# Patient Record
Sex: Male | Born: 1975 | Race: Black or African American | Hispanic: No
Health system: Western US, Academic
[De-identification: ages and names within clinical notes are randomized; demographics above are authoritative.]

## PROBLEM LIST (undated history)

## (undated) HISTORY — PX: HERNIA REPAIR: SHX51

---

## 2012-11-12 ENCOUNTER — Ambulatory Visit (HOSPITAL_BASED_OUTPATIENT_CLINIC_OR_DEPARTMENT_OTHER): Payer: No Typology Code available for payment source | Admitting: Clinical

## 2012-11-12 ENCOUNTER — Ambulatory Visit (HOSPITAL_BASED_OUTPATIENT_CLINIC_OR_DEPARTMENT_OTHER): Payer: No Typology Code available for payment source | Attending: Clinical

## 2012-11-12 DIAGNOSIS — Z658 Other specified problems related to psychosocial circumstances: Secondary | ICD-10-CM | POA: Insufficient documentation

## 2012-11-12 NOTE — Progress Notes (Signed)
Pt presents to [facility] triage nurse at [phone] this morning requesting assist in exploration of available mental health resources.  Pt reports concerns of paranoia, anger management issues and hx of PTSD; all in the setting of homelessness x 15 years.  Pt states that he is new to [place] x 3 days following arrival from [place], [place].  Pt states that he is currently sleeping outside on the street at night. Plan for pt to meet with [facility] Social Worker, Jaquita Rector MSW, for exploration of mental health and homeless resources.  Pt verbalizes understanding and agreement with plan.

## 2012-11-12 NOTE — Progress Notes (Signed)
SOCIAL WORK WALK-IN NOTE    11/12/2012  Brief description: Mental Health Resources    Patient description, presenting problem, patient / family goals: 37 yo male, referred by Triage RN for [facility] resources.    Per pt, he is new to [place] coming from [place].  He states he needs to return to [facility] services, as he was dropped by his MH providers at [facility].  He states he started working and he could no longer receive services.     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      He doesn't know [place] and reports he has difficulty reading.      He is originally from [place].   States he's been homeless x14 years.       Intervention / Outcome / Plan: SW support provided.   Provided pt with a map showing him where to catch the [employer] bus, and how to identify it by [employer], it's a block away.    Encouraged him to apply for his DSHS funding at the [place] office.  He needs to transfer his DSHS funding to [facility].    Explained how to go to [facility] to get services, once his funding is re started.     Provided him with a list of nearby substance programs.  He knows [facility].     Suggested he get a primary care provider.     Pt accepted information, but was upset he could not received MH services and get housing at this clinic.

## 2013-01-27 ENCOUNTER — Encounter (HOSPITAL_BASED_OUTPATIENT_CLINIC_OR_DEPARTMENT_OTHER): Payer: Self-pay | Admitting: Internal Medicine

## 2013-01-27 ENCOUNTER — Ambulatory Visit (HOSPITAL_BASED_OUTPATIENT_CLINIC_OR_DEPARTMENT_OTHER): Payer: No Typology Code available for payment source | Attending: Internal Medicine

## 2013-01-27 ENCOUNTER — Ambulatory Visit (HOSPITAL_BASED_OUTPATIENT_CLINIC_OR_DEPARTMENT_OTHER): Payer: No Typology Code available for payment source | Admitting: Internal Medicine

## 2013-01-27 VITALS — BP 128/70 | HR 84 | Temp 99.1°F | Resp 18 | Ht 74.0 in | Wt 177.0 lb

## 2013-01-27 DIAGNOSIS — Z23 Encounter for immunization: Secondary | ICD-10-CM | POA: Insufficient documentation

## 2013-01-27 DIAGNOSIS — F17200 Nicotine dependence, unspecified, uncomplicated: Secondary | ICD-10-CM

## 2013-01-27 DIAGNOSIS — J069 Acute upper respiratory infection, unspecified: Secondary | ICD-10-CM | POA: Insufficient documentation

## 2013-01-27 DIAGNOSIS — K469 Unspecified abdominal hernia without obstruction or gangrene: Secondary | ICD-10-CM | POA: Insufficient documentation

## 2013-01-27 DIAGNOSIS — F489 Nonpsychotic mental disorder, unspecified: Secondary | ICD-10-CM

## 2013-01-27 HISTORY — DX: Nicotine dependence, unspecified, uncomplicated: F17.200

## 2013-01-27 HISTORY — DX: Nonpsychotic mental disorder, unspecified: F48.9

## 2013-01-27 MED ORDER — DEXTROMETHORPHAN-GUAIFENESIN 10-100 MG/5ML OR LIQD
5.0000 mL | ORAL | Status: AC | PRN
Start: 2013-01-27 — End: ?

## 2013-01-27 MED ORDER — ACETAMINOPHEN 325 MG OR TABS
325.0000 mg | ORAL_TABLET | Freq: Four times a day (QID) | ORAL | Status: AC | PRN
Start: 2013-01-27 — End: ?

## 2013-01-27 NOTE — Progress Notes (Signed)
Pt presents to the [facility] triage nurse today c/o URI symptoms x 2 days. Pt is triaged to walk-in appt at [facility] today. Pt verbalizes understanding and agreement with plan.

## 2013-01-27 NOTE — Progress Notes (Signed)
[facility]   This note transcribed with voice recognition software. May contain errors.     Chief Complaint:  Chief Complaint   Patient presents with   . Hernia   . Headache   . URI   . FLU   . Fever   . Chills     Dalton Peck is a 37 year old year old English speaking male seen for an urgent care appointment in [facility] with chief complaint of cold symptoms and cough.  Patient is originally from [place].  He most recently moved here from [place], [place].  He is living at [facility].  He is in his first 30 days of sobriety and cannot easily attend appointments.  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    HISTORY PRESENT ILLNESS  1. URI/cough:  Patient complains of cold symptoms for last couple of days.  He has a low-grade fever doesn't feel particularly tired or achy but he has a productive cough for green phlegm.  He is a smoker and not quite ready to quit smoking.  He has tried patches in the past.  Burgess Estelle he felt wheezy but he has no history of asthma.    2.  Hernia  Patient states he noticed a lump to the right side of his groin for about 3 years.  It is not painful.  The lump seems to come and go.  He states he has not had any medical attention for this problem.    Problem list:  1.  History of mental health problems patient is currently taking Zyprexa and Abilify [facility]. Reports conflict with counselor.     Allergies  Milk/dairy  Depakote caused rash.    ROS:   Patient endorses symptoms of lightheadedness when he stands.  No history of heart disease, palpitations, irregular heartbeat, or syncope during exercise.    HABITS:  Bodybuilder     Physical Examination:   Filed Vitals:    01/27/13 0935   BP: 128/70   Pulse: 84   Temp: 99.1 F (37.3 C)   TempSrc: Temporal   Resp: 18   Height: 6\' 2"  (1.88 m)   Weight: 177 lb (80.287 kg)   SpO2: 100%   Alert muscular African-American male   GEN: alert male in NAD.      EAR, NOSE AND THROAT:  Unremarkable.   NECK:  Supple.  ,  no adenopathy to neck.   HEART:  Normal S1, S2, regular rhythm and rate.   CHEST:  Breath sounds are clear.   GENITOURINARY:  Patient has a 2 cm small inguinal hernia right side, easily reduced..      Assessment/Plan:    1. Need for prophylactic vaccination and inoculation against influenza   Plan: influenza vaccine quadrivalent PF (adult) 0.5     mL IM - 09811    2. URI, acute  Plan: Dextromethorphan-Guaifenesin 10-100 MG/5ML Oral        Liquid, Acetaminophen 325 MG Oral Tab    3.  Right-sided inguinal hernia.  Discussed signs and symptoms of incarceration and strangulation and when to seek care.  See visit summary. Discussed surgery is usually elective.    4.  Mental health issues.  Patient is having [facility]  He states security was called and he doesn't feel there was a reason for that.  He is interested in changing to a different mental health  agency.  I referred him to social work as I don't understand what his coverage limits are with [facility] and whether he is eligible to change providers.

## 2013-01-27 NOTE — Progress Notes (Signed)
FLU VACCINE SCREENING    Order date:01/27/2013  Ordering provider: Octavio Manns, ARNP  Vaccine given: INFLUENZA VACCINE  Clinic stock used: NO  Interpreter used?: None  Flu Vaccine information sheet discussed, patient verbalized understanding?  YES    SCREENING QUESTIONS:  1. Do you have a high fever, severe cold-like symptoms or serious infection?  NO  2. Do you have any food allergies such as eggs, chicken, chicken feathers, chicken dander, or gelatin?  NO  3. Are you allergic to any medication/s includeing neomycin, streptomycin, or polymyxin?  NO  4. Have you had any severe reaction to a vaccine in the past such as a seizure, a convulsion from a high fever, or a paralysis problem called Guillain-Barre Syndrome?  NO     Aniel, Cristoper, 01/27/2013 10:28 AM

## 2013-01-27 NOTE — Patient Instructions (Signed)
Hernia [Adult]    A hernia is a bulge of the intestines or surrounding tissues through a tear in the muscle of the abdomen or groin. This may occur as a result of excessive coughing, heavy lifting or being overweight. It can also occur at the site of prior surgery. When a hernia first appears it may be painful due to stretching and tearing of the muscle fibers. When you lie down, the bulge should reduce in size or disappear completely. If it does not, and you are unable to flatten it with your hand, medical attention is needed at once.  Home Care:  Avoid heavy lifting and straining or any activities that cause pain in the hernia.  Follow Up  with your physician as directed by our staff.  Get Prompt Medical Attention  if any of the following occur:   Increasing size of the hernia   Increasing pain in the hernia   A hernia that does not get smaller when you lie down   Hardening of the hernia   Abdominal swelling, fever or repeated vomiting   Pain moves to the lower right abdomen (just below the waistline) or spreads to the back   2000-2013 [employer], [address], [place] [address]. All rights reserved. This information is not intended as a substitute for professional medical care. Always follow your healthcare professional's instructions.

## 2013-02-05 ENCOUNTER — Ambulatory Visit (HOSPITAL_BASED_OUTPATIENT_CLINIC_OR_DEPARTMENT_OTHER): Payer: No Typology Code available for payment source

## 2013-02-05 ENCOUNTER — Ambulatory Visit (HOSPITAL_BASED_OUTPATIENT_CLINIC_OR_DEPARTMENT_OTHER): Payer: No Typology Code available for payment source | Admitting: Nurse Practitioner

## 2013-02-05 NOTE — Progress Notes (Signed)
Pt presents to [facility] triage nurse requesting care for a hernia which has become more painful. As no walk-in appointments are available at [facility] today, triage nurse encourages pt to seek eval at [facility] today. Pt verbalizes understanding and agreement with plan.

## 2013-02-05 NOTE — Progress Notes (Signed)
Presented to clinic for hernia problem. His insurance is not accepted here, but is at [facility]. Made him an appointment for today there. He was appreciative and went to appointment.

## 2013-04-10 ENCOUNTER — Other Ambulatory Visit (EMERGENCY_DEPARTMENT_HOSPITAL): Payer: Self-pay | Admitting: Emergency Medicine

## 2013-04-10 ENCOUNTER — Emergency Department (HOSPITAL_BASED_OUTPATIENT_CLINIC_OR_DEPARTMENT_OTHER)
Admission: EM | Admit: 2013-04-10 | Discharge: 2013-04-10 | Disposition: A | Discharge status: Home / Self Care | Payer: Medicaid Other | Attending: Emergency Medicine | Admitting: Emergency Medicine

## 2013-04-10 DIAGNOSIS — K469 Unspecified abdominal hernia without obstruction or gangrene: Secondary | ICD-10-CM | POA: Insufficient documentation

## 2013-04-10 DIAGNOSIS — R109 Unspecified abdominal pain: Secondary | ICD-10-CM

## 2013-04-10 DIAGNOSIS — F39 Unspecified mood [affective] disorder: Secondary | ICD-10-CM | POA: Insufficient documentation

## 2013-04-10 LAB — COMPREHENSIVE METABOLIC PANEL
ALT (GPT): 12 U/L (ref 10–64)
AST (GOT): 25 U/L (ref 15–40)
Albumin: 3.7 g/dL (ref 3.5–5.2)
Alkaline Phosphatase (Total): 65 U/L (ref 36–122)
Anion Gap: 4 (ref 3–11)
Bilirubin (Total): 0.2 mg/dL (ref 0.2–1.3)
Calcium: 9.1 mg/dL (ref 8.9–10.2)
Carbon Dioxide, Total: 29 mEq/L (ref 22–32)
Chloride: 104 mEq/L (ref 98–108)
Creatinine: 1.2 mg/dL — ABNORMAL HIGH (ref 0.51–1.18)
GFR, Calc, African American: >60 mL/min (ref >59)
GFR, Calc, European American: >60 mL/min (ref >59)
Glucose: 86 mg/dL (ref 62–125)
Potassium: 4.2 mEq/L (ref 3.7–5.2)
Protein (Total): 6.9 g/dL (ref 6.0–8.2)
Sodium: 137 mEq/L (ref 136–145)
Urea Nitrogen: 9 mg/dL (ref 8–21)

## 2013-04-10 LAB — CBC, DIFF
% Basophils: 0 %
% Eosinophils: 4 %
% Immature Granulocytes: 0 %
% Lymphocytes: 26 %
% Monocytes: 7 %
% Neutrophils: 63 %
Absolute Eosinophil Count: 0.38 10*3/uL (ref 0.00–0.50)
Absolute Lymphocyte Count: 2.55 10*3/uL (ref 1.00–4.80)
Basophils: 0.03 10*3/uL (ref 0.00–0.20)
Hematocrit: 41 % (ref 38–50)
Hemoglobin: 13.8 g/dL (ref 13.0–18.0)
Immature Granulocytes: 0.02 10*3/uL (ref 0.00–0.05)
MCH: 29.6 pg (ref 27.3–33.6)
MCHC: 34.1 g/dL (ref 32.2–36.5)
MCV: 87 fL (ref 81–98)
Monocytes: 0.66 10*3/uL (ref 0.00–0.80)
Neutrophils: 6.35 10*3/uL (ref 1.80–7.00)
Platelet Count: 320 10*3/uL (ref 150–400)
RBC: 4.66 mil/uL (ref 4.40–5.60)
RDW-CV: 12.2 % (ref 11.6–14.4)
WBC: 9.99 10*3/uL (ref 4.30–10.00)

## 2013-04-10 LAB — 1ST EXTRA BLUE TOP

## 2013-06-24 ENCOUNTER — Emergency Department (HOSPITAL_BASED_OUTPATIENT_CLINIC_OR_DEPARTMENT_OTHER)
Admission: EM | Admit: 2013-06-24 | Discharge: 2013-06-24 | Disposition: A | Discharge status: Home / Self Care | Payer: No Typology Code available for payment source | Attending: Physician Assistant | Admitting: Physician Assistant

## 2013-06-24 DIAGNOSIS — Z5900 Homelessness unspecified: Secondary | ICD-10-CM | POA: Insufficient documentation

## 2013-06-24 DIAGNOSIS — K4090 Unilateral inguinal hernia, without obstruction or gangrene, not specified as recurrent: Secondary | ICD-10-CM

## 2013-06-28 ENCOUNTER — Encounter (HOSPITAL_BASED_OUTPATIENT_CLINIC_OR_DEPARTMENT_OTHER): Payer: Self-pay | Admitting: Surgery

## 2013-06-28 ENCOUNTER — Ambulatory Visit (HOSPITAL_BASED_OUTPATIENT_CLINIC_OR_DEPARTMENT_OTHER): Payer: No Typology Code available for payment source

## 2013-06-28 ENCOUNTER — Ambulatory Visit (HOSPITAL_BASED_OUTPATIENT_CLINIC_OR_DEPARTMENT_OTHER): Payer: No Typology Code available for payment source | Attending: Surgery | Admitting: Surgery

## 2013-06-28 ENCOUNTER — Other Ambulatory Visit (HOSPITAL_BASED_OUTPATIENT_CLINIC_OR_DEPARTMENT_OTHER): Payer: Self-pay

## 2013-06-28 VITALS — BP 121/73 | HR 81 | Temp 97.7°F | Resp 19 | Ht 72.25 in | Wt 171.1 lb

## 2013-06-28 DIAGNOSIS — K4090 Unilateral inguinal hernia, without obstruction or gangrene, not specified as recurrent: Secondary | ICD-10-CM | POA: Insufficient documentation

## 2013-06-28 DIAGNOSIS — Z01818 Encounter for other preprocedural examination: Secondary | ICD-10-CM | POA: Insufficient documentation

## 2013-06-28 NOTE — Progress Notes (Addendum)
Patient Referred By: Kirby CriglerGatewood, Medley O'Keef*  Patient's PCP: No, Pcp     Subjective:  Patient is a 38 year old male, here to discuss hernia    HPI  Pt noticed pain in R groin circa 2011, then 2013 found a bulge there that tended to be worse when he worked out (likes to [employer] weights). Recently has been bothering him more and more. States it will bulge out and feel painful. Able to reduce when lying down. He's quit working out because of it. No instances where he can't get it back in. Occ nausea when it pops out but no vomiting, fevers. No recent illnesses. Never had surgery and states he is very anxious about it.     PMH: depression  PSH: none  Meds: none currently; has taken meds for depression in the past, but he does not currently have a psychiatric provider  Allergies: depakote (rash)  Fam hx: no known hx of bleeding problems or trouble with anesthesia  Social: [facility]. [place]. Likes to bodybuild. Quit smoking 4.5 weeks ago. Previously used cocaine and EtoH; quit 3 months ago.     Review of Systems   Constitutional: Negative.    HENT: Negative.    Eyes: Negative.    Respiratory: Negative.    Cardiovascular: Negative.    Gastrointestinal:        Per HPI   Genitourinary:        Per HPI     Musculoskeletal: Negative.    Skin: Negative.          Objective:  BP 121/73   Pulse 81   Temp(Src) 97.7 F (36.5 C) (Temporal)   Resp 19   Ht 6' 0.25" (1.835 m)   Wt 171 lb 1.2 oz (77.599 kg)   BMI 23.05 kg/m2       Physical Exam   Constitutional: He is oriented to person, place, and time. He appears well-developed and well-nourished.   HENT:   Mouth/Throat: Oropharynx is clear and moist.   Eyes: Conjunctivae are normal. Pupils are equal, round, and reactive to light.   Pulmonary/Chest: Effort normal.   Abdominal: Soft.   2x2 cm bulge in R groin, easily reducible. No L groin bulge.    Musculoskeletal: Normal range of motion.   Neurological: He is alert and oriented to person, place, and  time.   Skin: Skin is warm and dry.        Assessment and Plan:   38 yo M with RIH, reducible, no hx or evidence of incarceration. He is amenable to surgical repair now that it is bothering him every day and keeping him from working out. Discussed the risks and benefits of the procedure, which he is nervous about, but he is a good surgical candidate. He agrees to proceed with R open inguinal hernia repair with mesh.     Plan  Surgery scheduled for May 5 with Dr Logan BoresEvans, consent signed  Strict return precatuions for incarceration in the meantime  Advised him to continue abstaining from smoking       -------------------------------------------  Attending: Kimber RelicHeather Leigh Evans  I saw and evaluated the patient and agree with Dr. Christy Sartoriusondino's note.   Additional diagnoses: depression  ----------------------------------------

## 2013-06-28 NOTE — Patient Instructions (Addendum)
Hernia [Adult]    A hernia is a bulge of the intestines or surrounding tissues through a tear in the muscle of the abdomen or groin. This may occur as a result of excessive coughing, heavy lifting or being overweight. It can also occur at the site of prior surgery. When a hernia first appears it may be painful due to stretching and tearing of the muscle fibers. When you lie down, the bulge should reduce in size or disappear completely. If it does not, and you are unable to flatten it with your hand, medical attention is needed at once.  Home Care:  Avoid heavy lifting and straining or any activities that cause pain in the hernia.  Follow Up  with your physician as directed by our staff.  Get Prompt Medical Attention  if any of the following occur:   Increasing size of the hernia   Increasing pain in the hernia   A hernia that does not get smaller when you lie down   Hardening of the hernia   Abdominal swelling, fever or repeated vomiting   Pain moves to the lower right abdomen (just below the waistline) or spreads to the back   2000-2013 [employer], [address], [place] [address]. All rights reserved. This information is not intended as a substitute for professional medical care. Always follow your healthcare professional's instructions.        Hernia Repair Surgery  A hernia (or "rupture") is a weakness or defect in the wall of the abdomen. A hernia will not heal on its own. Surgery is needed to repair the defect in the abdominal wall. If not treated, a hernia can get larger. It can also lead to serious medical complications. Fortunately, hernia surgery can be done quickly and safely. Below is an overview of hernia repair surgery.  Preparing for Surgery  Your doctor will talk with you about preparing for surgery. Follow all the instructions you're given and be sure to:   Tell your doctor about any medications, supplements, or herbs you take. This includes both prescription and  over-the-counter items. Ask if you should stop taking any of them.   Stop taking aspirin, ibuprofen, and naproxen 14 days before surgery.   Arrange for an adult family member or friend to give you a ride home after surgery.   Stop smoking. Smoking affects blood flow and can slow healing.   Gently wash the surgical area the night before surgery.   Don't eat or drink for 10 hours before your surgery.  The Day of Surgery  Arrive at the hospital or surgical center at your scheduled time. You'll be asked to change into a patient gown. You'll then be given an IV to provide fluids and medication. Shortly before surgery, an anesthesiologist will talk with you. He or she will explain the types of anesthesia used to prevent pain during surgery. You will have one or more of the following:   Monitored sedation to make you relaxed and sleepy.   Local anesthesia to numb the surgical site.   Regional anesthesia to numb specific areas of your body.   General anesthesia to let you sleep during surgery.  Fixing the Weakness  Surgery treats a hernia by repairing the weakness in the abdominal wall. Most hernias are treated using "tension-free" repairs. This is surgery that uses special mesh materials to repair the weak area. The mesh covers the weak area like a patch. The mesh is made of strong, flexible plastic that stays   in the body. Over time, nearby tissues grow into the mesh to strengthen the repair.  After Surgery  When the procedure is over, you'll be taken to the recovery area to rest. Your blood pressure and heart rate will be monitored. You'll also have a bandage over the surgical site. To help reduce discomfort, you'll be given pain medications. You may also be given breathing exercises to keep your lungs clear. Later, you'll be asked to get up and walk. This helps prevent blood clots in the legs. You can go home when your doctor says you're ready.  Risks and Possible Complications of Hernia  Surgery   Bleeding   Infection   Numbness or pain in the groin or leg   Risk the hernia will recur   Damage to the testicles or testicular function  Anesthesia risks   Mesh complications   Inability to urinate   Bowel or bladder injury         2000-2013 [employer], [address], [place] [address]. All rights reserved. This information is not intended as a substitute for professional medical care. Always follow your healthcare professional's instructions.

## 2013-06-28 NOTE — Addendum Note (Signed)
Addended by: Kimber RelicEVANS, Aasiya Creasey LEIGH on: 06/28/2013 09:36 AM     Modules accepted: Level of Service

## 2013-06-29 LAB — R/O MRSA

## 2013-08-03 ENCOUNTER — Ambulatory Visit (HOSPITAL_BASED_OUTPATIENT_CLINIC_OR_DEPARTMENT_OTHER)
Admission: RE | Admit: 2013-08-03 | Discharge: 2013-08-03 | Disposition: A | Discharge status: Home / Self Care | Payer: No Typology Code available for payment source | Attending: Surgery | Admitting: Surgery

## 2013-08-03 ENCOUNTER — Ambulatory Visit (HOSPITAL_BASED_OUTPATIENT_CLINIC_OR_DEPARTMENT_OTHER): Payer: No Typology Code available for payment source | Admitting: Surgery

## 2013-08-03 ENCOUNTER — Ambulatory Visit: Payer: Self-pay

## 2013-08-03 DIAGNOSIS — F411 Generalized anxiety disorder: Secondary | ICD-10-CM | POA: Insufficient documentation

## 2013-08-03 DIAGNOSIS — R209 Unspecified disturbances of skin sensation: Secondary | ICD-10-CM | POA: Insufficient documentation

## 2013-08-03 DIAGNOSIS — K4090 Unilateral inguinal hernia, without obstruction or gangrene, not specified as recurrent: Secondary | ICD-10-CM | POA: Insufficient documentation

## 2013-08-03 DIAGNOSIS — F329 Major depressive disorder, single episode, unspecified: Secondary | ICD-10-CM | POA: Insufficient documentation

## 2013-08-03 DIAGNOSIS — Z87891 Personal history of nicotine dependence: Secondary | ICD-10-CM | POA: Insufficient documentation

## 2013-08-03 DIAGNOSIS — K219 Gastro-esophageal reflux disease without esophagitis: Secondary | ICD-10-CM | POA: Insufficient documentation

## 2013-08-03 DIAGNOSIS — F3289 Other specified depressive episodes: Secondary | ICD-10-CM | POA: Insufficient documentation

## 2013-08-03 NOTE — Telephone Encounter (Signed)
Patient states he had surgery today.  States he was only given a couple of days worth of nausea medicine. States is wearing a scopalamine patch and has ondansetron.  Wonders when he will start better and how long it takes the anesthesia to wear off completely.  Wonders if he will ever be able to lift weights again.   Patient is taking fluids, but afraid to eat.  States he is trying to hold off from taking pain medication.  Worried about constipation.  States he is walking around.  Worried about coughing and breaking his stitches.   Protocol: POST-OP SYMPTOMS AND QUESTIONS-ADULT-AH  Affirmative: Other post-op symptom or question (all triage questions negative)  Disposition of Home Care suggested.  Instructed that he should feel a little better each day that passes.  Instructed to take pain medication if the discomfort gets above 4/10.  Reinforced discharge instructions.  Instructed patient to talk with his surgeon about when it would be safe to lift weights.  Instructed to call back at any point if symptoms or more questions.

## 2013-08-03 NOTE — Telephone Encounter (Signed)
-----   Message from Seleta RhymesIrish Joy E Layador sent at 08/03/2013  9:11 PM PDT -----  Regarding: Post hernia surgery -wound care question   >> IRISH Ovidio HangerJOY E LAYADOR 08/03/2013 09:11 PM  Post hernia surgery -wound care question

## 2013-08-05 ENCOUNTER — Ambulatory Visit: Payer: Self-pay

## 2013-08-05 NOTE — Telephone Encounter (Signed)
-----   Message from Ward Chattersowena W Argana sent at 08/05/2013 11:34 PM PDT -----  Regarding: Just had hernia surgery, groin feels swollen, ribs hurt and feeling nauseated.  >> Valinda PartyROWENA W [facility] Munras AveRGANA 08/05/2013 11:34 PM  Just had hernia surgery, groin feels swollen, ribs hurt and feeling nauseated.

## 2013-08-05 NOTE — Telephone Encounter (Signed)
Patient calling c/o "moderate shortness of breath"  Started the day he left the hospital.  Stated he has never had this before.  C/O right side lower rib pain, below nipple line.  C/O swelling to right groin area that has increased since surgery.  C/O 6/10 pain when "I touch it".    Stated he has been applying ice to the area.  Denies redness or drainage to incision.  Reports chills and some nausea,  Ate 2 sandwiches,and drank apple juice and water today.  Denies chest pain, vomiting, diarrhea, lightheaded/dizzy, numbness, tingling, weakness, headache, change in vision, arm, jaw, back pain.   Patient states he went to the emergency room yesterday and was told he was dehydrated.  Stated shortness of breath is the same as yesterday.  Taking ibuprofen and tylenol for pain.  Stated he does not want to take the "oxy".  Patient called 911.  Protocol: POST-OP SYMPTOMS AND QUESTIONS-ADULT-AH(05/05)  Affirmative: Difficulty breathing  Protocol: BREATHING DIFFICULTY-ADULT-AH  Affirmative: Major surgery in the past month  Disposition of Go to ED Now suggested.

## 2013-08-09 ENCOUNTER — Ambulatory Visit (HOSPITAL_BASED_OUTPATIENT_CLINIC_OR_DEPARTMENT_OTHER): Payer: No Typology Code available for payment source

## 2013-08-18 ENCOUNTER — Encounter (HOSPITAL_BASED_OUTPATIENT_CLINIC_OR_DEPARTMENT_OTHER): Payer: No Typology Code available for payment source

## 2013-10-18 ENCOUNTER — Encounter (HOSPITAL_BASED_OUTPATIENT_CLINIC_OR_DEPARTMENT_OTHER): Payer: No Typology Code available for payment source

## 2014-05-24 ENCOUNTER — Ambulatory Visit (HOSPITAL_BASED_OUTPATIENT_CLINIC_OR_DEPARTMENT_OTHER): Payer: No Typology Code available for payment source

## 2014-06-01 ENCOUNTER — Encounter (HOSPITAL_BASED_OUTPATIENT_CLINIC_OR_DEPARTMENT_OTHER): Payer: No Typology Code available for payment source | Admitting: Clinical Social Worker

## 2014-06-01 ENCOUNTER — Telehealth (HOSPITAL_BASED_OUTPATIENT_CLINIC_OR_DEPARTMENT_OTHER): Payer: Self-pay | Admitting: Clinical Social Worker

## 2014-06-01 NOTE — Telephone Encounter (Signed)
Please read message below. Thank you

## 2014-06-01 NOTE — Telephone Encounter (Signed)
Apt rescheduled

## 2014-06-01 NOTE — Telephone Encounter (Signed)
CONFIRMED PHONE NUMBER: [phone]  CALLERS FIRST AND LAST NAME: Dalton PerchesJafantino Vadize Peck  FACILITY NAME: NA TITLE: NA  CALLERS RELATIONSHIP:Self  RETURN CALL: Detailed message on voicemail only     SUBJECT: Cancellation/Reschedule   REASON FOR CANCELLATION: Other "Overslept"  RESCHEDULED: NO    MESSAGE: Please call back to reschedule. Thanks!

## 2014-06-07 NOTE — Progress Notes (Signed)
Mr. Dalton Peck did not cancel and was not present for a scheduled appointment today.  Disposition: Pt called contact center and said he overslept.   Spoke w/pt. He said he rescheduled with screener but that he will not be able to keep new appointment d2 classes he will be taking. CM encouraged him to call screening line again.

## 2014-06-14 ENCOUNTER — Ambulatory Visit (HOSPITAL_BASED_OUTPATIENT_CLINIC_OR_DEPARTMENT_OTHER): Payer: No Typology Code available for payment source | Admitting: Mental Health

## 2014-06-14 DIAGNOSIS — F331 Major depressive disorder, recurrent, moderate: Secondary | ICD-10-CM

## 2014-06-14 DIAGNOSIS — F4310 Post-traumatic stress disorder, unspecified: Secondary | ICD-10-CM
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
## 2014-06-21 ENCOUNTER — Ambulatory Visit (HOSPITAL_BASED_OUTPATIENT_CLINIC_OR_DEPARTMENT_OTHER): Payer: No Typology Code available for payment source | Attending: Psychiatry | Admitting: Psychiatry

## 2014-06-21 DIAGNOSIS — F1910 Other psychoactive substance abuse, uncomplicated: Secondary | ICD-10-CM | POA: Insufficient documentation

## 2014-06-21 DIAGNOSIS — F331 Major depressive disorder, recurrent, moderate: Secondary | ICD-10-CM

## 2014-06-21 DIAGNOSIS — F1921 Other psychoactive substance dependence, in remission: Secondary | ICD-10-CM | POA: Insufficient documentation

## 2014-06-21 MED ORDER — CITALOPRAM HYDROBROMIDE 20 MG OR TABS
20.0000 mg | ORAL_TABLET | Freq: Every day | ORAL | Status: AC
Start: 2014-06-21 — End: ?

## 2014-06-21 MED ORDER — CITALOPRAM HYDROBROMIDE 10 MG OR TABS
10.0000 mg | ORAL_TABLET | Freq: Every day | ORAL | Status: AC
Start: 2014-06-21 — End: ?
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
## 2014-06-28 ENCOUNTER — Encounter (HOSPITAL_BASED_OUTPATIENT_CLINIC_OR_DEPARTMENT_OTHER): Payer: No Typology Code available for payment source | Admitting: Mental Health

## 2014-07-01 NOTE — Progress Notes (Signed)
Mr. Dalton Peck did not cancel and was not present for a scheduled appointment today.  Disposition: Support staff will follow up with patient

## 2014-07-19 ENCOUNTER — Encounter (HOSPITAL_BASED_OUTPATIENT_CLINIC_OR_DEPARTMENT_OTHER): Payer: No Typology Code available for payment source | Admitting: Psychiatry

## 2014-08-31 ENCOUNTER — Telehealth (HOSPITAL_BASED_OUTPATIENT_CLINIC_OR_DEPARTMENT_OTHER): Payer: Self-pay | Admitting: Mental Health

## 2014-08-31 NOTE — Telephone Encounter (Signed)
Attempted to call patient.  Phone not accepting incoming calls.

## 2015-04-12 DIAGNOSIS — F32A Depression, unspecified: Secondary | ICD-10-CM

## 2015-04-12 DIAGNOSIS — F1920 Other psychoactive substance dependence, uncomplicated: Secondary | ICD-10-CM

## 2015-04-12 DIAGNOSIS — F209 Schizophrenia, unspecified: Secondary | ICD-10-CM

## 2015-04-12 DIAGNOSIS — F4310 Post-traumatic stress disorder, unspecified: Secondary | ICD-10-CM

## 2015-04-12 DIAGNOSIS — F329 Major depressive disorder, single episode, unspecified: Secondary | ICD-10-CM

## 2015-04-19 NOTE — Congregational Nurse Program (Signed)
Client agreed to mental health assessment and GAIN short screener given to client completed per self.  Client referred to [facility]ps for establishment visit and [facility] for mental health walk in clinic.  Client agreed with referral. Given a copy of the referral and paperwork to fill out for [facility] medical.  Alphonse Guild, RN

## 2015-08-23 ENCOUNTER — Encounter (HOSPITAL_COMMUNITY): Payer: Self-pay | Admitting: Emergency Medicine

## 2015-08-23 ENCOUNTER — Emergency Department (HOSPITAL_COMMUNITY)
Admission: EM | Admit: 2015-08-23 | Discharge: 2015-08-24 | Disposition: A | Discharge status: Home / Self Care | Payer: Self-pay | Attending: Emergency Medicine | Admitting: Emergency Medicine

## 2015-08-23 DIAGNOSIS — F17200 Nicotine dependence, unspecified, uncomplicated: Secondary | ICD-10-CM | POA: Insufficient documentation

## 2015-08-23 DIAGNOSIS — Z8659 Personal history of other mental and behavioral disorders: Secondary | ICD-10-CM | POA: Insufficient documentation

## 2015-08-23 DIAGNOSIS — R1031 Right lower quadrant pain: Secondary | ICD-10-CM | POA: Insufficient documentation

## 2015-08-23 NOTE — ED Notes (Signed)
[facility] and called EMS due to abdominal pain.  He rates his pain 0/10 but was at a 10 when he bent over earlier patient did not fall.  EMS did not give him anything the pain resolved on its own.  The patient ambulated to the truck with no problem.  Denies any other symptoms.  No distention or guarding on palpation.

## 2015-08-23 NOTE — ED Provider Notes (Signed)
History  By signing my name below, I, Earmon Phoenix, attest that this documentation has been prepared under the direction and in the presence of Burgess Amor, PA-C. Electronically Signed: Earmon Phoenix, ED Scribe. 08/23/2015. 12:01 AM.  Chief Complaint  Patient presents with  . Abdominal Pain    The [facility] and called EMS due to abdominal pain.  He rates his pain 0/10 but was at a 10 when he bent over earlier.  EMS did not give him anything the pain resolved on its own.     The history is provided by the patient and medical records. No language interpreter was used.    HPI Comments:  Wesley Duffy is a 40 y.o. male brought in by EMS who presents to the Emergency Department complaining of sharp RLQ abdominal pain that began PTA and last only for a few minutes. He reports he has had similar symptoms in the past (last episode almost one year ago, last summer) and has been evaluated for this in the past. He states he was bending over when the pain began. He states he intermittently feels a "bulge" in the area that the pain presents. He states the current pain has resolved on its own but reports the area is still mildly sore to palpation and movement. He states he had eaten about 45 minutes prior to the pain beginning.  He denies nausea, vomiting, constipation, dysuria, back pain, fever. He reports a history of right inguinal hernia repair. He denies any daily medications. He does not have a PCP.   History reviewed. No pertinent past medical history. Past Surgical History  Procedure Laterality Date  . Hernia repair     No family history on file. Social History  Substance Use Topics  . Smoking status: Current Every Day Smoker  . Smokeless tobacco: None  . Alcohol Use: No    Review of Systems  Constitutional: Negative for fever.  HENT: Negative for congestion and sore throat.   Eyes: Negative.   Respiratory: Negative for chest tightness and shortness of  breath.   Cardiovascular: Negative for chest pain.  Gastrointestinal: Positive for abdominal pain. Negative for nausea, vomiting and constipation.  Genitourinary: Negative.  Negative for dysuria.  Musculoskeletal: Negative for back pain, joint swelling, arthralgias and neck pain.  Skin: Negative.  Negative for rash and wound.  Neurological: Negative for dizziness, weakness, light-headedness, numbness and headaches.  Psychiatric/Behavioral: Negative.     Allergies  Review of patient's allergies indicates not on file.  Home Medications   Prior to Admission medications   Not on File   Triage Vitals: BP 130/80 mmHg  Pulse 71  Temp(Src) 98.2 F (36.8 C) (Oral)  Resp 16  Ht  (1.854 m)  SpO2 99% Physical Exam  Constitutional: He appears well-developed and well-nourished.  HENT:  Head: Normocephalic and atraumatic.  Eyes: Conjunctivae are normal.  Neck: Normal range of motion.  Cardiovascular: Normal rate, regular rhythm, normal heart sounds and intact distal pulses.   Pulmonary/Chest: Effort normal and breath sounds normal. He has no wheezes.  Abdominal: Soft. Bowel sounds are normal. He exhibits no distension and no mass.  Pt reports being nontender in RLQ but is guarding with wincing on exam of this area. No distention or mass which he is unable to reproduce with valsalva maneuvers.    Musculoskeletal: Normal range of motion.  Neurological: He is alert.  Skin: Skin is warm and dry.  Psychiatric: He has a normal mood and affect.  Nursing  note and vitals reviewed.   ED Course  Procedures (including critical care time) DIAGNOSTIC STUDIES: Oxygen Saturation is 99% on RA, normal by my interpretation.   COORDINATION OF CARE: 11:48 PM- Will order urinalysis and lab work. Will order CT scan. Pt verbalizes understanding and agrees to plan.  Medications  iopamidol (ISOVUE-300) 61 % injection (100 mLs  Contrast Given 08/24/15 0129)    Labs Review Labs Reviewed  CBC WITH  DIFFERENTIAL/PLATELET - Abnormal; Notable for the following:    WBC 12.0 (*)    All other components within normal limits  COMPREHENSIVE METABOLIC PANEL - Abnormal; Notable for the following:    Creatinine, Ser 1.37 (*)    ALT 15 (*)    All other components within normal limits  URINALYSIS, ROUTINE W REFLEX MICROSCOPIC (NOT [facility])    Imaging Review Ct Abdomen Pelvis W Contrast  08/24/2015  CLINICAL DATA:  Acute onset of right lower quadrant abdominal pain and nausea. Initial encounter. EXAM: CT ABDOMEN AND PELVIS WITH CONTRAST TECHNIQUE: Multidetector CT imaging of the abdomen and pelvis was performed using the standard protocol following bolus administration of intravenous contrast. CONTRAST:  100mL ISOVUE-300 IOPAMIDOL (ISOVUE-300) INJECTION 61% COMPARISON:  None. FINDINGS: The visualized lung bases are clear. The liver and spleen are unremarkable in appearance. The gallbladder is within normal limits. The pancreas and adrenal glands are unremarkable. The kidneys are unremarkable in appearance. There is no evidence of hydronephrosis. No renal or ureteral stones are seen. No perinephric stranding is appreciated. No free fluid is identified. The small bowel is unremarkable in appearance. The stomach is within normal limits. No acute vascular abnormalities are seen. The appendix is grossly unremarkable in appearance, without evidence of appendicitis. The colon is difficult to fully assess due to motion artifact, but appears grossly unremarkable. The bladder is mildly distended and grossly unremarkable. The prostate remains normal in size. No inguinal lymphadenopathy is seen. No acute osseous abnormalities are identified. IMPRESSION: No acute abnormality seen within the abdomen or pelvis. Electronically Signed   By: Roanna RaiderJeffery  Chang M.D.   On: 08/24/2015 02:08   I have personally reviewed and evaluated these images and lab results as part of my medical decision-making.   EKG Interpretation None       MDM   Final diagnoses:  Right lower quadrant abdominal pain    Pt with rlq pain, improved but with elevated wbc count. CT imaging performed to r/o appy , hernia, or other source of pain.  Negative for acute findings.  Sx possibly functional, gas trapping/intestinal spasms. No acute findings today.  Results discussed with pt who was pain free at time of dc.referral given to establish pcp.  Upon initial interview and exam, pt was confrontation, accused provider of being condescending by asking him questions in a condescending tone of voice.  RN and scribe in the room at the time who both witnessed no such tone on the providers part.  He became more cooperative with history giving during the end of the interview.  Review of chart indicates history of PTSD, schizophrenia and substance abuse, although not documented as such in his chart. With exam findings of RLQ guarding, CT performed as I was unsure of pt's honesty with exam. He was A & O x 3 however, does not appear altered.   I personally performed the services described in this documentation, which was scribed in my presence. The recorded information has been reviewed and is accurate.   Burgess AmorJulie Jaydan Chretien, PA-C 08/24/15 1239  Loren Raceravid Yelverton,  MD 08/25/15 [phone]

## 2015-08-23 NOTE — ED Notes (Addendum)
[facility]  States he bent over 30 min ago and had 7/10 "knotting" pain to RLQ that lasted approx 6-8 min. Pain has resolved and pt denies any other symptoms.

## 2015-08-24 ENCOUNTER — Emergency Department (HOSPITAL_COMMUNITY): Payer: Self-pay

## 2015-08-24 ENCOUNTER — Encounter (HOSPITAL_COMMUNITY): Payer: Self-pay | Admitting: Radiology

## 2015-08-24 LAB — CBC WITH DIFFERENTIAL/PLATELET
BASOS ABS: 0 10*3/uL (ref 0.0–0.1)
BASOS PCT: 0 %
Eosinophils Absolute: 0.4 10*3/uL (ref 0.0–0.7)
Eosinophils Relative: 3 %
HEMATOCRIT: 40.5 % (ref 39.0–52.0)
HEMOGLOBIN: 13.5 g/dL (ref 13.0–17.0)
Lymphocytes Relative: 33 %
Lymphs Abs: 4 10*3/uL (ref 0.7–4.0)
MCH: 28.1 pg (ref 26.0–34.0)
MCHC: 33.3 g/dL (ref 30.0–36.0)
MCV: 84.2 fL (ref 78.0–100.0)
Monocytes Absolute: 1 10*3/uL (ref 0.1–1.0)
Monocytes Relative: 8 %
NEUTROS ABS: 6.7 10*3/uL (ref 1.7–7.7)
NEUTROS PCT: 56 %
Platelets: 336 10*3/uL (ref 150–400)
RBC: 4.81 MIL/uL (ref 4.22–5.81)
RDW: 12 % (ref 11.5–15.5)
WBC: 12 10*3/uL — ABNORMAL HIGH (ref 4.0–10.5)

## 2015-08-24 LAB — URINALYSIS, ROUTINE W REFLEX MICROSCOPIC
Bilirubin Urine: NEGATIVE
Glucose, UA: NEGATIVE mg/dL
Hgb urine dipstick: NEGATIVE
KETONES UR: NEGATIVE mg/dL
LEUKOCYTES UA: NEGATIVE
NITRITE: NEGATIVE
PH: 5.5 (ref 5.0–8.0)
PROTEIN: NEGATIVE mg/dL
Specific Gravity, Urine: 1.023 (ref 1.005–1.030)

## 2015-08-24 LAB — COMPREHENSIVE METABOLIC PANEL
ALBUMIN: 3.7 g/dL (ref 3.5–5.0)
ALK PHOS: 58 U/L (ref 38–126)
ALT: 15 U/L — AB (ref 17–63)
AST: 26 U/L (ref 15–41)
Anion gap: 5 (ref 5–15)
BILIRUBIN TOTAL: 0.5 mg/dL (ref 0.3–1.2)
BUN: 7 mg/dL (ref 6–20)
CO2: 29 mmol/L (ref 22–32)
Calcium: 9 mg/dL (ref 8.9–10.3)
Chloride: 106 mmol/L (ref 101–111)
Creatinine, Ser: 1.37 mg/dL — ABNORMAL HIGH (ref 0.61–1.24)
GFR calc Af Amer: >60 mL/min (ref >60)
GFR calc non Af Amer: >60 mL/min (ref >60)
GLUCOSE: 72 mg/dL (ref 65–99)
POTASSIUM: 3.5 mmol/L (ref 3.5–5.1)
Sodium: 140 mmol/L (ref 135–145)
TOTAL PROTEIN: 6.9 g/dL (ref 6.5–8.1)

## 2015-08-24 MED ORDER — IOPAMIDOL (ISOVUE-300) INJECTION 61%
INTRAVENOUS | Status: AC
  Administered 2015-08-24: 100 mL
  Filled 2015-08-24: qty 100

## 2015-08-24 NOTE — Discharge Instructions (Signed)

## 2015-08-24 NOTE — ED Notes (Signed)
Patient transported to CT 

## 2017-05-31 IMAGING — CT CT ABD-PELV W/ CM
2 of 3 series · 17 of 46 positions shown, 19 images · IV contrast (Omni 300)
Comparison: None.

CLINICAL DATA: Acute onset of right lower quadrant abdominal pain
and nausea. Initial encounter.

EXAM:
CT ABDOMEN AND PELVIS WITH CONTRAST
TECHNIQUE: Multidetector CT imaging of the abdomen and pelvis was performed
using the standard protocol following bolus administration of
intravenous contrast.
CONTRAST:  100mL EAGZ9F-5TT IOPAMIDOL (EAGZ9F-5TT) INJECTION 61%

[Series 2: a/p w/ 5mm · axial · 0.78mm/px · z∈[-517,-112]mm · 14 of 95 slices shown, 16 images]
[im 7/95  soft-tissue]
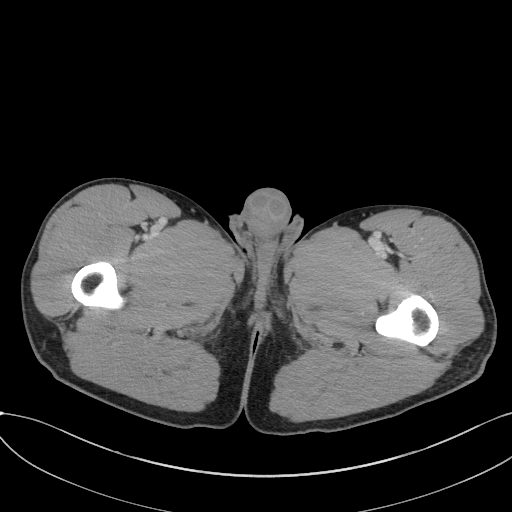
[im 7/95  bone]
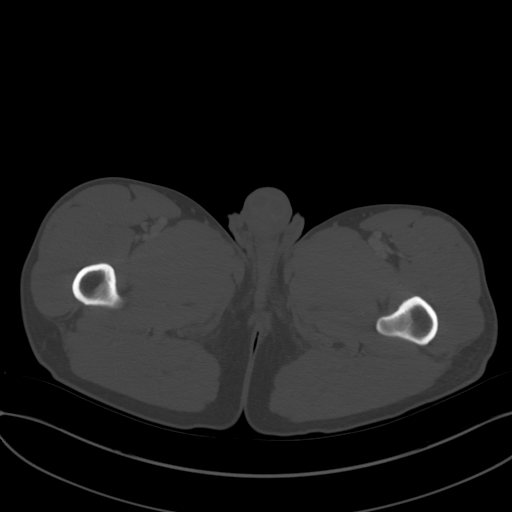
[im 13/95  soft-tissue]
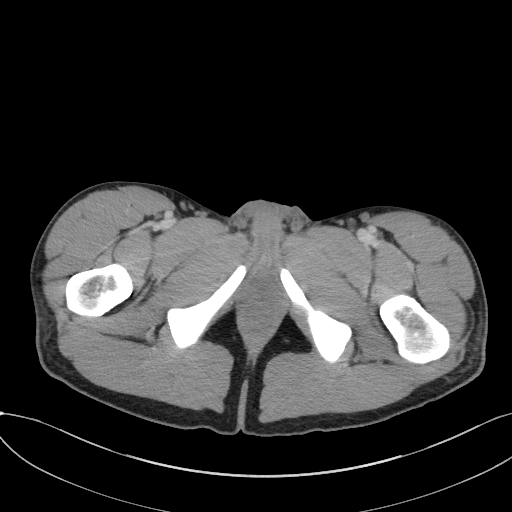
[im 19/95  soft-tissue]
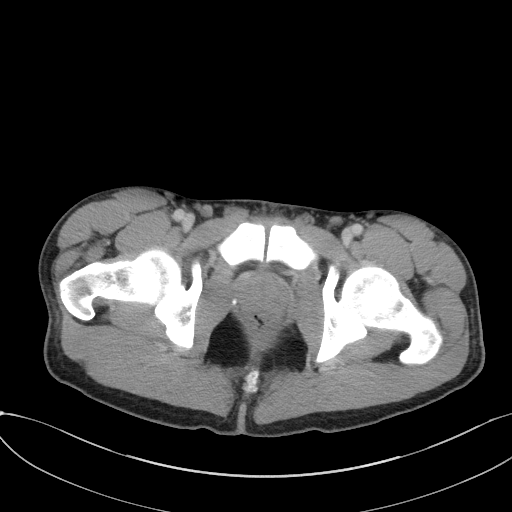
[im 25/95  soft-tissue]
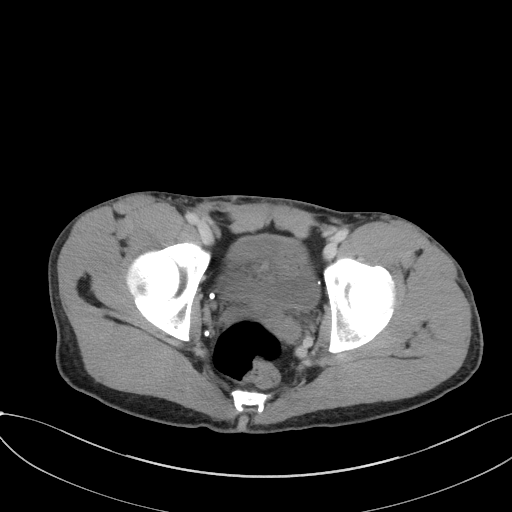
[im 31/95  soft-tissue]
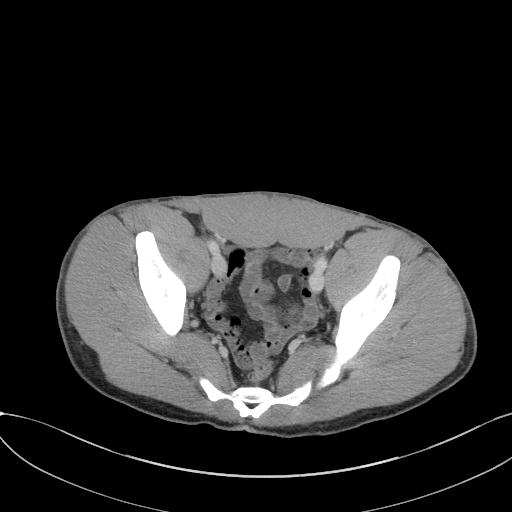
[im 37/95  soft-tissue]
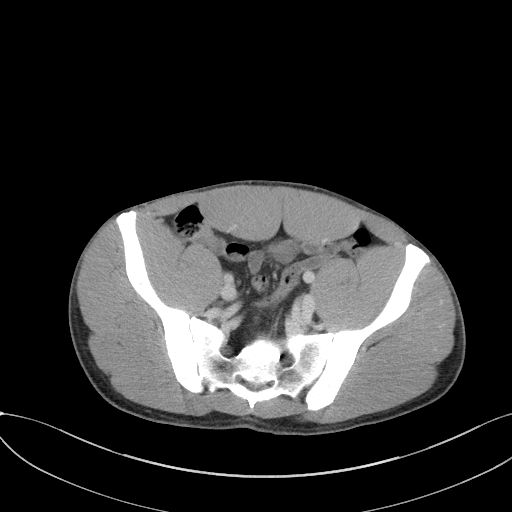
[im 43/95  soft-tissue]
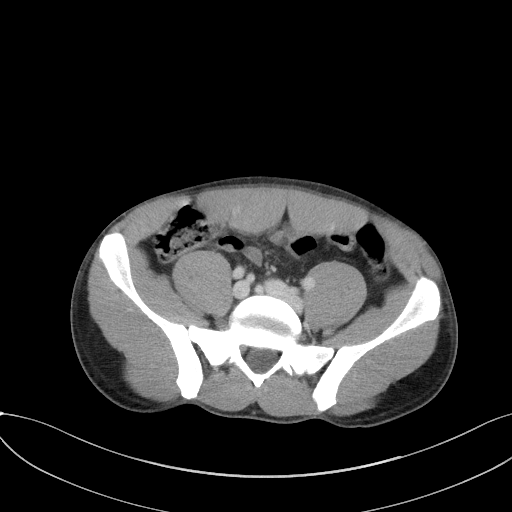
[im 52/95  soft-tissue]
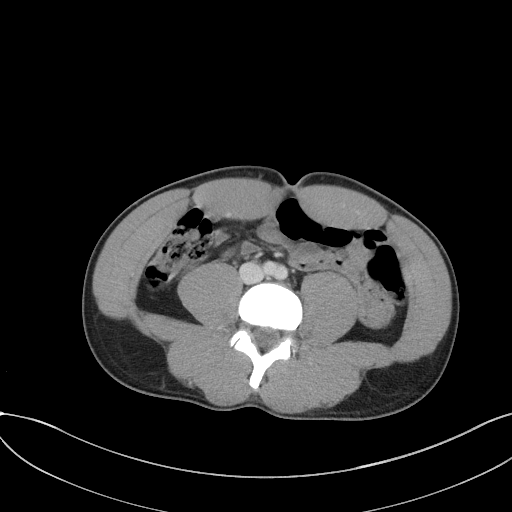
[im 58/95  soft-tissue]
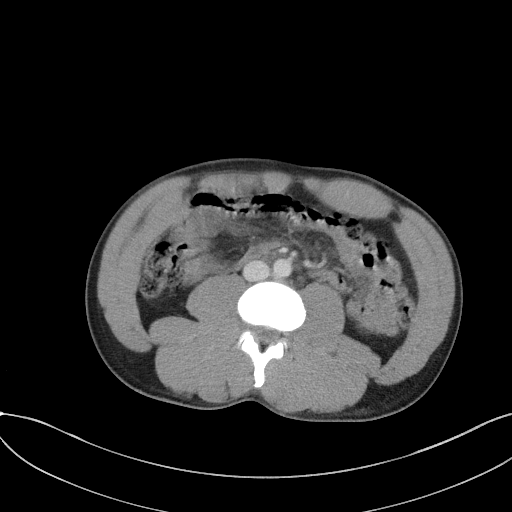
[im 58/95  bone]
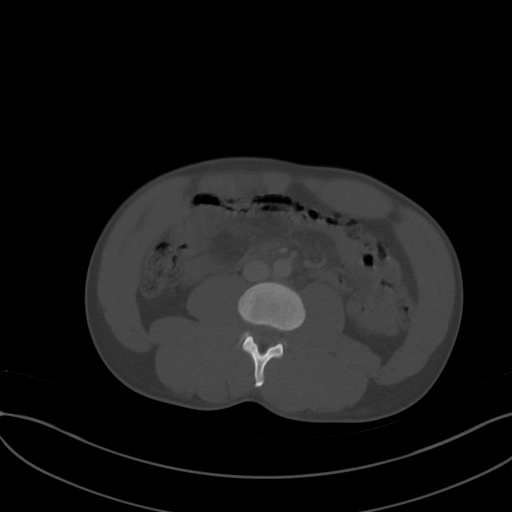
[im 64/95  soft-tissue]
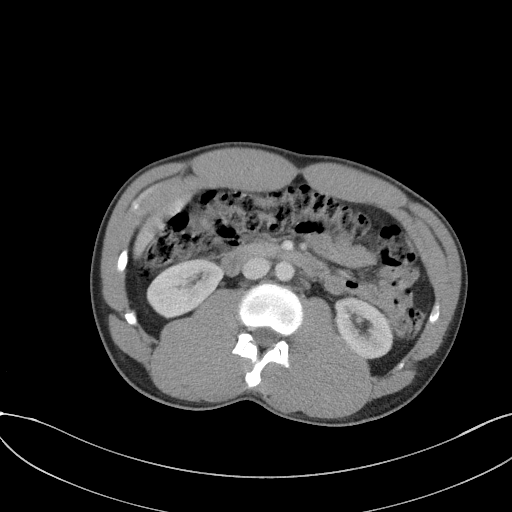
[im 70/95  soft-tissue]
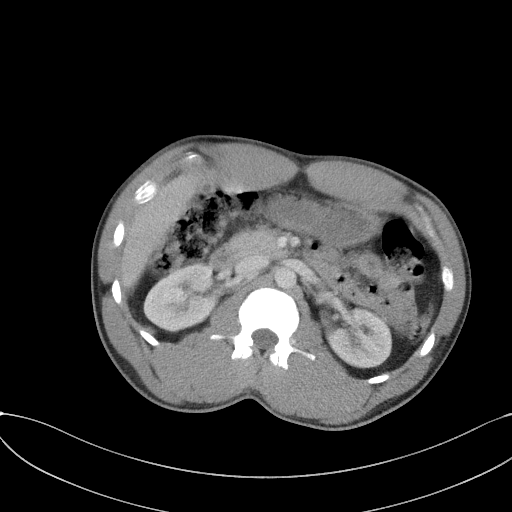
[im 76/95  soft-tissue]
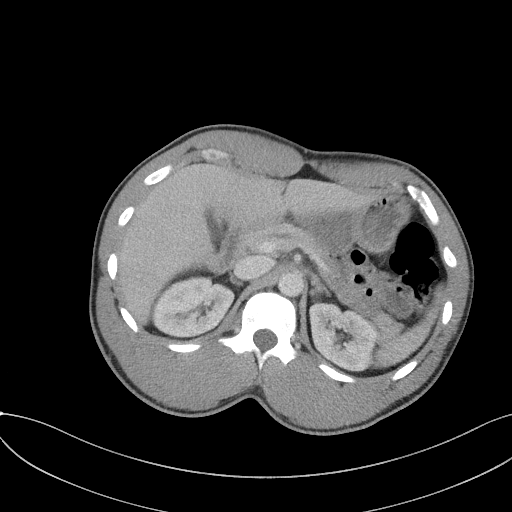
[im 82/95  soft-tissue]
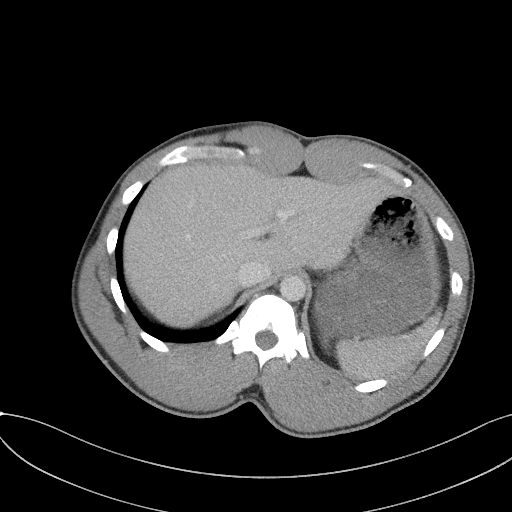
[im 88/95  soft-tissue]
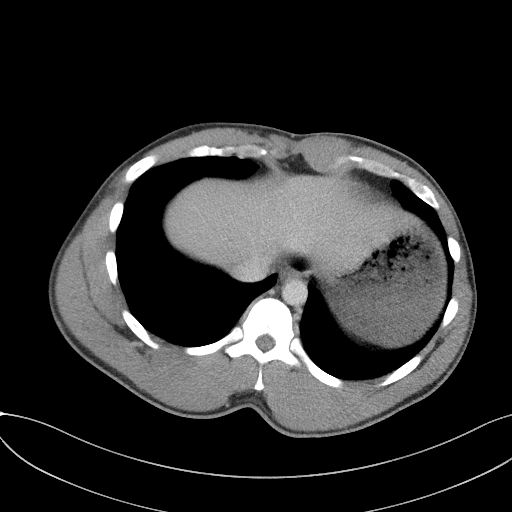

[Series 6: a/p w/ cor · coronal · 0.82mm/px · 3 of 117 slices shown]
[im 39/117  soft-tissue]
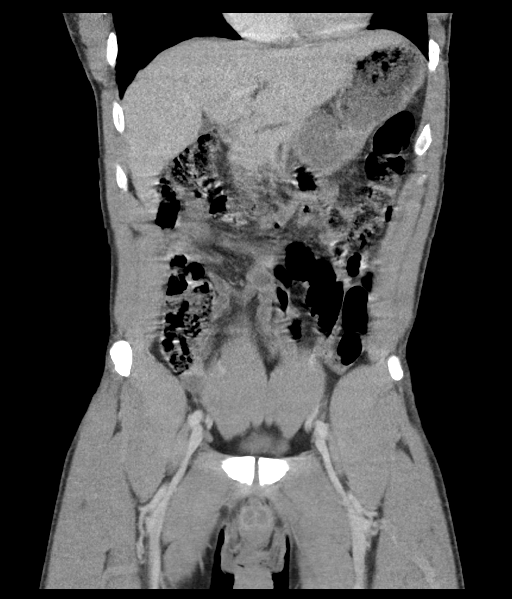
[im 52/117  soft-tissue]
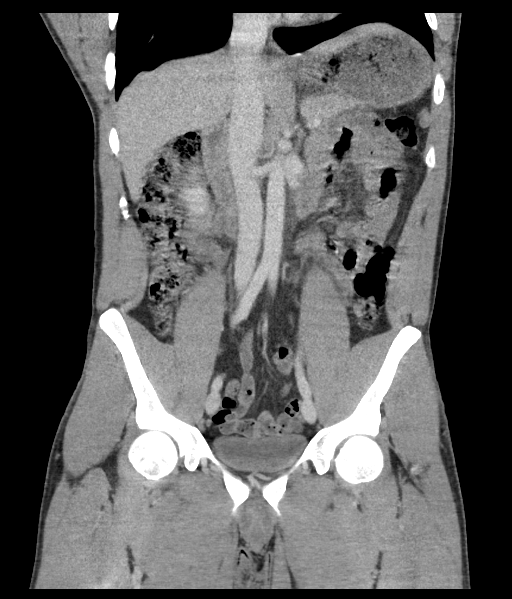
[im 65/117  soft-tissue]
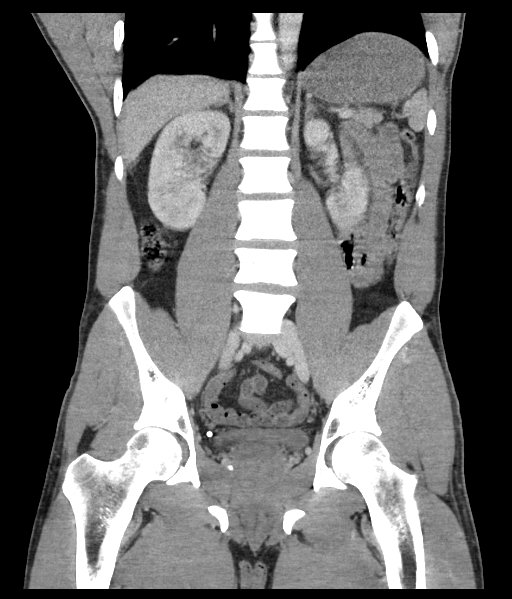

[17 of 46 positions shown; findings below may reference images not displayed]

FINDINGS: The visualized lung bases are clear.

The liver and spleen are unremarkable in appearance. The gallbladder
is within normal limits. The pancreas and adrenal glands are
unremarkable.

The kidneys are unremarkable in appearance. There is no evidence of
hydronephrosis. No renal or ureteral stones are seen. No perinephric
stranding is appreciated.

No free fluid is identified. The small bowel is unremarkable in
appearance. The stomach is within normal limits. No acute vascular
abnormalities are seen.

The appendix is grossly unremarkable in appearance, without evidence
of appendicitis. The colon is difficult to fully assess due to
motion artifact, but appears grossly unremarkable.

The bladder is mildly distended and grossly unremarkable. The
prostate remains normal in size. No inguinal lymphadenopathy is
seen.

No acute osseous abnormalities are identified.
IMPRESSION: No acute abnormality seen within the abdomen or pelvis.

## 2017-07-23 ENCOUNTER — Ambulatory Visit: Payer: Self-pay

## 2017-07-23 NOTE — Telephone Encounter (Signed)
Not a 911 Call.  He drank a sports/caffein drink and now he is nauseated.  Nausea Vomiting Adult protocol  Thank you    Reason for Disposition   MILD or MODERATE vomiting (e.g., 1 - 5 times / day) (all triage questions negative)    Additional Information   Negative: [1] SEVERE vomiting (e.g., 6 or more times/day, vomits everything) BUT [2] hydrated (all triage questions negative)   Negative: Vomiting is a chronic symptom (recurrent or ongoing AND present > 4 weeks)   Negative: [1] MILD vomiting with diarrhea AND [2] present > 5 days   Negative: Alcohol or drug abuse, known or suspected   Negative: Vomiting a prescription medication   Negative: [1] MILD or MODERATE vomiting AND [2] present > 48 hours (2 days) (Exception: mild vomiting with associated diarrhea)   Negative: Fever present > 3 days (72 hours)   Negative: [1] MILD to MODERATE vomiting (e.g., 1-5 times/day) AND [2] abdomen looks much more swollen than usual   Negative: [1] Vomiting AND [2] contains bile (green color)   Negative: [1] Constant abdominal pain AND [2] present > 2 hours   Negative: [1] Fever > 103 F (39.4 C) AND [2] not able to get the fever down using Fever Care Advice   Negative: [1] Fever > 101 F (38.3 C) AND [2] age > 6060   Negative: [1] Fever > 101 F (38.3 C) AND [2] bedridden (e.g., nursing home patient, CVA, chronic illness, recovering from surgery)   Negative: [1] Fever > 100.5 F (38.1 C) AND [2] weak immune system (e.g., HIV positive, cancer chemo, splenectomy, organ transplant, chronic steroids)   Negative: Taking any of the following medications: digoxin (Lanoxin), lithium, theophylline, phenytoin (Dilantin)   Negative: [1] SEVERE vomiting (e.g., 6 or more times/day) AND [2] present > 8 hours   Negative: [1] MODERATE vomiting (e.g., 3 - 5 times/day) AND [2] age > 2160   Negative: Severe headache (e.g., excruciating) (Exception: similar to previous migraines)   Negative: High-risk adult (e.g., diabetes mellitus, brain  tumor, V-P shunt, hernia)   Negative: [1] Drinking very little AND [2] dehydration suspected (e.g., no urine > 12 hours, very dry mouth, very lightheaded)   Negative: Patient sounds very sick or weak to the triager   Negative: [1] Vomiting AND [2] contains red blood or black ("coffee ground") material  (Exception: few red streaks in vomit that only happened once)   Negative: Severe pain in one eye   Negative: Recent head injury (within last 3 days)   Negative: Recent abdominal injury (within last 3 days)   Negative: [1] Insulin-dependent diabetes (Type I) AND [2] glucose > 400 mg/dl (22 mmol/l)   Negative: Vomiting occurs only while coughing   Negative: [1] Pregnant < 20 Weeks AND [2] nausea/vomiting began in early pregnancy (i.e., 4-[redacted] weeks pregnant)   Negative: Chest pain   Negative: [1] SEVERE headache AND [2] similar to prior migraines   Negative: Shock suspected (e.g., cold/pale/clammy skin, too weak to stand, low BP, rapid pulse)   Negative: Difficult to awaken or acting confused  (e.g., disoriented, slurred speech)   Negative: Sounds like a life-threatening emergency to the triager    Protocols [facility]: [facility]

## 2017-07-23 NOTE — Telephone Encounter (Signed)
Regarding: [facility]- SICK, NAUSEOUS, THINKS HE MIGHT BE DEHYDRATED   ----- Message from Virgilio BellingBriana Micaela Kennedy sent at 07/23/2017 10:01 PM PDT -----  [facility]- SICK, NAUSEOUS, THINKS HE MIGHT BE DEHYDRATED

## 2017-07-29 ENCOUNTER — Inpatient Hospital Stay: Payer: Self-pay

## 2017-07-29 ENCOUNTER — Ambulatory Visit: Payer: Self-pay

## 2017-07-29 NOTE — Telephone Encounter (Signed)
Jash is not interested in triage or help with finding a clinic. He is in [place] with an upset stomach.  Abdominal Pain Upper Adult protocol. He may or may not follow the protocol.      Reason for Disposition   [1] MILD-MODERATE pain AND [2] constant AND [3] present > 2 hours    Additional Information   Negative: Patient sounds very sick or weak to the triager   Negative: [1] Pregnant > 24 weeks AND [2] hand or face swelling   Negative: [1] SEVERE pain (e.g., excruciating) AND [2] present > 1 hour   Negative: [1] Pain lasts > 10 minutes AND [2] age > 50   Negative: [1] Pain lasts > 10 minutes AND [2] age > 85 AND [3] associated chest, arm, neck, upper back or jaw pain   Negative: [1] Pain lasts > 10 minutes AND [2] age > 71 AND [3] at least one cardiac risk factor (i.e., hypertension, diabetes, obesity, smoker or strong family history of heart disease)   Negative: [1] Pain lasts > 10 minutes AND [2] history of heart disease (i.e., heart attack, bypass surgery, angina, angioplasty, CHF; not just a heart murmur)   Negative: [1] Pain lasts > 10 minutes AND [2] difficulty breathing   Negative: [1] Vomiting AND [2] contains red blood  (Exception: few streaks and only occurred once)   Negative: [1] Vomiting AND [2] contains black ("coffee ground") material   Negative: Blood in bowel movements  (Exception: Blood on surface of BM with constipation)   Negative: Black or tarry bowel movements  (Exception: chronic-unchanged  black-grey bowel movements AND is taking iron pills or Pepto-bismol)   Negative: Followed an abdomen (stomach) injury   Negative: Chest pain   Negative: Severe difficulty breathing (e.g., struggling for each breath, speaks in single words)   Negative: Shock suspected (e.g., cold/pale/clammy skin, too weak to stand, low BP, rapid pulse)   Negative: Difficult to awaken or acting confused  (e.g., disoriented, slurred speech)   Negative: Passed out (i.e., lost consciousness,  collapsed and was not responding)   Negative: Visible sweat on face or sweat dripping down face   Negative: Sounds like a life-threatening emergency to the triager    Protocols used: ABDOMINAL PAIN - UPPER-ADULT-AH

## 2017-07-29 NOTE — Telephone Encounter (Signed)
Regarding: External: acid reflux causing chest discomfort  ----- Message from Marva Panda sent at 07/29/2017  4:46 PM PDT -----  External: acid reflux causing chest discomfort

## 2017-08-08 ENCOUNTER — Emergency Department (HOSPITAL_BASED_OUTPATIENT_CLINIC_OR_DEPARTMENT_OTHER): Admission: EM | Admit: 2017-08-08 | Discharge: 2017-08-08 | Payer: BLUE CROSS/BLUE SHIELD

## 2017-08-28 ENCOUNTER — Emergency Department (HOSPITAL_BASED_OUTPATIENT_CLINIC_OR_DEPARTMENT_OTHER)
Admission: EM | Admit: 2017-08-28 | Discharge: 2017-08-29 | Disposition: A | Discharge status: Home / Self Care | Payer: BLUE CROSS/BLUE SHIELD | Attending: Emergency Medicine | Admitting: Emergency Medicine

## 2017-08-28 DIAGNOSIS — N189 Chronic kidney disease, unspecified: Secondary | ICD-10-CM | POA: Insufficient documentation

## 2017-08-28 DIAGNOSIS — R079 Chest pain, unspecified: Secondary | ICD-10-CM | POA: Insufficient documentation

## 2017-08-29 ENCOUNTER — Other Ambulatory Visit: Payer: Self-pay | Admitting: Emergency Medicine

## 2017-08-29 ENCOUNTER — Other Ambulatory Visit: Payer: Self-pay

## 2017-08-29 DIAGNOSIS — R079 Chest pain, unspecified: Secondary | ICD-10-CM

## 2017-08-29 LAB — CBC, DIFF
% Basophils: 1 %
% Eosinophils: 1 %
% Immature Granulocytes: 0 %
% Lymphocytes: 29 %
% Monocytes: 8 %
% Neutrophils: 61 %
% Nucleated RBC: 0 %
Absolute Eosinophil Count: 0.07 10*3/uL (ref 0.00–0.50)
Absolute Lymphocyte Count: 3.13 10*3/uL (ref 1.00–4.80)
Basophils: 0.05 10*3/uL (ref 0.00–0.20)
Hematocrit: 42 % (ref 38–50)
Hemoglobin: 14.2 g/dL (ref 13.0–18.0)
Immature Granulocytes: 0.02 10*3/uL (ref 0.00–0.05)
MCH: 28.8 pg (ref 27.3–33.6)
MCHC: 33.7 g/dL (ref 32.2–36.5)
MCV: 85 fL (ref 81–98)
Monocytes: 0.8 10*3/uL (ref 0.00–0.80)
Neutrophils: 6.62 10*3/uL (ref 1.80–7.00)
Nucleated RBC: 0 10*3/uL
Platelet Count: 359 10*3/uL (ref 150–400)
RBC: 4.93 10*6/uL (ref 4.40–5.60)
RDW-CV: 11.9 % (ref 11.6–14.4)
WBC: 10.69 10*3/uL — ABNORMAL HIGH (ref 4.30–10.00)

## 2017-08-29 LAB — BASIC METABOLIC PANEL
Anion Gap: 6 (ref 4–12)
Calcium: 9.7 mg/dL (ref 8.9–10.2)
Carbon Dioxide, Total: 30 meq/L (ref 22–32)
Chloride: 102 meq/L (ref 98–108)
Creatinine: 1.41 mg/dL — ABNORMAL HIGH (ref 0.51–1.18)
GFR, Calc, African American: >60 mL/min/{1.73_m2} (ref >59)
GFR, Calc, European American: 56 mL/min/{1.73_m2} — ABNORMAL LOW (ref >59)
Glucose: 101 mg/dL (ref 62–125)
Potassium: 3.4 meq/L — ABNORMAL LOW (ref 3.6–5.2)
Sodium: 138 meq/L (ref 135–145)
Urea Nitrogen: 12 mg/dL (ref 8–21)

## 2017-08-29 LAB — TROPONIN_I
Troponin_I Interpretation: NORMAL
Troponin_I: <0.03 ng/mL (ref <0.04)

## 2017-08-29 LAB — B_TYPE NATRIURETIC PEPTIDE: B_Type Natriuretic Peptide: 18 pg/mL (ref <101)

## 2017-08-29 LAB — GC&CHLAM NUCLEIC ACID DETECTN
Chlam Trachomatis Nucleic Acid: NEGATIVE
N.Gonorrhoeae(GC) Nucleic Acid: NEGATIVE

## 2017-08-29 LAB — EKG 12 LEAD
Atrial Rate: 68 {beats}/min
Diagnosis: NORMAL
P Axis: 70 degrees
P-R Interval: 166 ms
Q-T Interval: 408 ms
QRS Duration: 90 ms
QTC Calculation: 433 ms
R Axis: 57 degrees
T Axis: 49 degrees
Ventricular Rate: 68 {beats}/min

## 2017-08-29 LAB — HEPATIC FUNCTION PANEL
ALT (GPT): 13 U/L (ref 10–64)
AST (GOT): 19 U/L (ref 9–38)
Albumin: 4.4 g/dL (ref 3.5–5.2)
Alkaline Phosphatase (Total): 66 U/L (ref 36–122)
Bilirubin (Direct): 0.1 mg/dL (ref 0.0–0.3)
Bilirubin (Total): 0.5 mg/dL (ref 0.2–1.3)
Protein (Total): 8.1 g/dL (ref 6.0–8.2)

## 2017-08-29 LAB — PROTHROMBIN & PTT
Partial Thromboplastin Time: 37 s — ABNORMAL HIGH (ref 22–35)
Prothrombin INR: 1.1 (ref 0.8–1.3)
Prothrombin Time Patient: 14.1 s (ref 10.7–15.6)

## 2017-08-29 LAB — LIPASE: Lipase: 17 U/L (ref <70)

## 2017-09-08 ENCOUNTER — Telehealth (HOSPITAL_BASED_OUTPATIENT_CLINIC_OR_DEPARTMENT_OTHER): Payer: Self-pay

## 2017-09-08 NOTE — Telephone Encounter (Signed)
I spoke with Visual merchandiserJanness/ER Resource RN, she will contact patient to discuss test results.      We look forward to seeing patient 09/15/17 for continued work up of his elevated creatinine.

## 2017-09-08 NOTE — Telephone Encounter (Signed)
(  TEXTING IS AN OPTION FOR [facility]INICS ONLY)  Is this a [facility] clinic? No      RETURN CALL: Detailed message on voicemail only      SUBJECT:  General Message     REASON FOR REQUEST: test results from ED visit on 05/31st.     MESSAGE: patient would like to know what his results are from 05/31st. Please Advise. Thank you

## 2017-09-15 ENCOUNTER — Ambulatory Visit (HOSPITAL_BASED_OUTPATIENT_CLINIC_OR_DEPARTMENT_OTHER): Payer: BLUE CROSS/BLUE SHIELD | Attending: Nurse Practitioner | Admitting: Nurse Practitioner

## 2017-09-15 ENCOUNTER — Encounter (HOSPITAL_BASED_OUTPATIENT_CLINIC_OR_DEPARTMENT_OTHER): Payer: Self-pay | Admitting: Nurse Practitioner

## 2017-09-15 VITALS — BP 130/68 | HR 97 | Temp 97.2°F | Resp 16 | Ht 73.0 in | Wt 165.8 lb

## 2017-09-15 DIAGNOSIS — F32A Depression, unspecified: Secondary | ICD-10-CM

## 2017-09-15 DIAGNOSIS — F329 Major depressive disorder, single episode, unspecified: Secondary | ICD-10-CM

## 2017-09-15 DIAGNOSIS — F419 Anxiety disorder, unspecified: Secondary | ICD-10-CM

## 2017-09-15 DIAGNOSIS — R7989 Other specified abnormal findings of blood chemistry: Secondary | ICD-10-CM | POA: Insufficient documentation

## 2017-09-15 DIAGNOSIS — Z6821 Body mass index (BMI) 21.0-21.9, adult: Secondary | ICD-10-CM

## 2017-09-15 LAB — BASIC METABOLIC PANEL
Anion Gap: 5 (ref 4–12)
Calcium: 9.6 mg/dL (ref 8.9–10.2)
Carbon Dioxide, Total: 33 meq/L — ABNORMAL HIGH (ref 22–32)
Chloride: 102 meq/L (ref 98–108)
Creatinine: 1.31 mg/dL — ABNORMAL HIGH (ref 0.51–1.18)
GFR, Calc, African American: >60 mL/min/{1.73_m2} (ref >59)
GFR, Calc, European American: >60 mL/min/{1.73_m2} (ref >59)
Glucose: 83 mg/dL (ref 62–125)
Potassium: 4.1 meq/L (ref 3.6–5.2)
Sodium: 140 meq/L (ref 135–145)
Urea Nitrogen: 6 mg/dL — ABNORMAL LOW (ref 8–21)

## 2017-09-15 LAB — ALBUMIN/CREATININE RATIO, RANDOM URINE
Albumin (Micro), URN: <0.7 mg/dL
Albumin/Creatinine Ratio, URN: <15 mg/g{creat} (ref <30)
Creatinine/Unit, URN: 46 mg/dL

## 2017-09-15 MED ORDER — MELATONIN 3 MG OR TABS
ORAL_TABLET | ORAL | 1 refills | Status: AC
Start: 2017-09-15 — End: ?

## 2017-09-15 NOTE — Patient Instructions (Addendum)
Thank you for coming in today.  It was nice to meet you!      1.  Please go to the lab to get blood work and a urine test    2.  Then please follow up with Financial Counseling to look into insurance/coverage options.  We will see you back here in the After Care Clinic in a few weeks to review the results and help you get set up with a regular primary care provider.      3.  The Financial Counselor will give you a pass to take to the pharmacy (across from the ER) for Melatonin to help with sleep.  You can take 1/2 to up to 3 tablets at bedtime.      Call if you have any questions or concerns at [phone]

## 2017-09-15 NOTE — Progress Notes (Signed)
AFTER CARE CLINIC VISIT NOTE    DATE: 09/15/2017     ID/CC:  Dalton Peck is a 42 year old male who is here today for f/u from ED visit on 5/31 for CP, constipation/diarrhea and GERD. An interpreter was not needed for the visit.    PROBLEM LIST:  Patient Active Problem List    Diagnosis Date Noted    Hernia, inguinal R 01/27/2013    Smoker 01/27/2013    Major depressive disorder, recurrent episode, moderate (HCC) 06/21/2014     with some post-traumatic features      Polysubstance dependence in early, early partial, sustained full, or sustained partial remission (HCC) 06/21/2014    Hernia 01/27/2013     INTERVAL HISTORY/HPI:  Dalton Peck is a 42 yo man with no PMH. He presented to the ED with sxs of squeezing L-sided chest pressure/pain that started 1 month prior after having 2-3 high caffeine drinks before working out. Work up in the ED consisted of lab work (+ for Creatinine of 1.4, GFR >60, K+ 3.4), EKG was normal without noted ischemia, CXR and Troponin were also normal. He also noted some constipation that was resolving and was concerned about a possible STI due to recent unprotected intercourse with a new partner. He had urine for CT/GC sent that was pending at the time of d/c. He was advised to f/u with [facility].     Currently he states his CP has improved.  He takes OTC Gaviscon (TUMS) with relief of sxs approximately 1x/week. He denies SOB, cough, n/v. He states his constipation has resolved and he denies diarrhea.  BMs are 2x/day to every other day, no BRBPR. He endorses weight loss and estimates approximately 20 lbs over a 3 month period. He states he had a poor appetite and reduced his food intake, but is feeling like his appetite is coming back. He was drinking protein shakes and taking supplements (No XPLODE-a caffeine, pre work-out enhancer, Vit C, Vit D, fish oil and a herbal medication for ED), which he has recently stopped. He is focusing on eating more healthy foods and is drinking a  lot more water and coconut water. He endorses feeling sad, but denies SI/HI. He has trouble falling asleep and feels anxious "all the time".  He states in the past he was prescribed some medication but can't recall the name. He will look at records he has at home.      ROS:  Noted above, remainder neg.      PAST MEDICAL HISTORY:  Past Medical History:   Diagnosis Date    Mental health problem 01/27/2013    Smoker 01/27/2013     MEDICATIONS:  Currently none    ALLERGIES:  Review of patient's allergies indicates:  Allergies   Allergen Reactions    Depakote [Divalproex Sodium] Rash    Lactose Intolerance (Gi)        P/SH:  [place]rents and shares space with others that he doesn't know).  Working as a Electrical engineersecurity guard, looking for a second job.   Habits:  +tob in the past (since age 89) recently quit cigarettes but vaping (6mg  oil, going through 1 bottle every 5 days)    FH:  Reports "cancer runs in my family". Prostate cancer (uncle), breast cancer (sister). Great aunt and great uncle with some form of cancer (unsure).     PHYSICAL EXAM:  Vitals:    09/15/17 1011   BP: 130/68  BP Cuff Size: Regular   BP Site: Right Arm   BP Position: Sitting   Pulse: 97   Resp: 16   Temp: 97.2 F (36.2 C)   TempSrc: Temporal   SpO2: 98%   Weight: 165 lb 12.8 oz (75.2 kg)   Height: 6\' 1"  (1.854 m)   GENERAL:  Anxious appearing man, cooperative and in NAD, A&Ox4  HEENT:  NC/AT, PER, Sclera anicteric, MMM  Chest/Resp:  Normal rate/effort, speaking in complete sentences, CTA-B, symmetrical expansion  Cor:  S1, S2, RRR  Ext:  Neg c/c/e  Neuro:  Moving all extremities equally, steady gait  Psych:  Noted above.      DIAGNOSTIC STUDIES/SCREENING/LAB RESULTS:  (See above)  09/15/17  GAD-7:  21/21  PHQ-9:  21/27    08/29/17  Creatinine 1.4, GFR:  >60    RADIOGRAPHS OF THE CHEST, 2 VIEWS    CLINICAL INDICATION:   Chest Pain    COMPARISONS:   04/10/1947    FINDINGS AND IMPRESSION:   Cardiac and  mediastinal contours are normal.     The lungs and pleural spaces are clear. There is no pneumothorax.    No acute bone or soft tissue abnormality.    07/29/17 ([facility]):  Creatinine 1.4    04/10/13  Creatinine:  1.2    ASSESSMENT AND PLAN:  Dalton Peck was seen today for ER follow up.    Diagnoses and all orders for this visit:    Elevated serum creatinine-noted slightly elevated Cr in 2015 as well.  Pt stated he has been lifting weights and supplementing with protein powder shakes and taking work-out enhancer OTC supplements that contain high doses of caffeine (400-800 mg). He stopped this over the past few weeks and is working on eating a healthy diet/increasing his water intake.    -     Basic Metabolic Panel  -     URINE SCREEN, MICROALBUMINURIA    Anxiety and depression-high GAD-7 and PHQ-9 scores. He denies SI/HI.  Will focus on sleep currently and pt plans to try and locate previous records (was seeing a therapist and prescribed medications in 2015 in [place]).    -     Melatonin 3 MG Oral Tab; Take 1/2 to up to 3 tablets at night for sleep  - Follow up in 5 weeks for results of above tests and reassessment of sxs.      PCP-Pt was NOT assigned a PCP as he is currently uninsured. He will go to [facility] today to check into options.  We will see him back in [facility] in 5 weeks to review the above issues and hopefully set him up with a PCP at [facility].

## 2017-10-09 ENCOUNTER — Telehealth (HOSPITAL_BASED_OUTPATIENT_CLINIC_OR_DEPARTMENT_OTHER): Payer: Self-pay | Admitting: Nurse Practitioner

## 2017-10-09 NOTE — Telephone Encounter (Signed)
(  TEXTING IS AN OPTION FOR [facility]IN[facility] ONLY)  Is this a [facility] clinic? No      RETURN CALL: Detailed message on voicemail only      SUBJECT:  Form/Letter/Paperwork Request     REASON FOR REQUEST: Return to Work [employer]work     FORM/LETTER/PAPERWORK IS FOR: Return to work: date out 2015, date back 10/09/2017  WHO SHOULD FILL IT OUT?: Dekeyser, Rinaldo CloudPamela J  WILL PATIENT PICK UP?: NO - out of state  NEEDED BY: 10/09/2017  ADDITIONAL INFORMATION: Patient is now out of state and needs a return to work letter as soon as possible. Please call patient to coordinate, as he has limited access to a computer/printer.    He would also like to speak with his care team regarding his recent lab results.    This matter urgent per caller.    Thank you!

## 2017-10-09 NOTE — Telephone Encounter (Signed)
Message left. The patient does not require a letter to return to work. Call back number of [phone] left in a voice mail.

## 2017-10-27 ENCOUNTER — Encounter (HOSPITAL_BASED_OUTPATIENT_CLINIC_OR_DEPARTMENT_OTHER): Payer: BLUE CROSS/BLUE SHIELD

## 2018-01-28 ENCOUNTER — Inpatient Hospital Stay: Payer: Self-pay

## 2019-10-24 ENCOUNTER — Emergency Department
Admission: EM | Admit: 2019-10-24 | Discharge: 2019-10-24 | Disposition: A | Discharge status: Home / Self Care | Payer: Self-pay | Attending: Diagnostic Radiology | Admitting: Diagnostic Radiology

## 2019-10-24 ENCOUNTER — Emergency Department (EMERGENCY_DEPARTMENT_HOSPITAL): Payer: Self-pay

## 2019-10-24 ENCOUNTER — Other Ambulatory Visit: Payer: Self-pay

## 2019-10-24 ENCOUNTER — Inpatient Hospital Stay: Payer: Self-pay

## 2019-10-24 ENCOUNTER — Other Ambulatory Visit (HOSPITAL_BASED_OUTPATIENT_CLINIC_OR_DEPARTMENT_OTHER): Payer: Self-pay

## 2019-10-24 ENCOUNTER — Ambulatory Visit
Admission: RE | Admit: 2019-10-24 | Discharge: 2019-10-24 | Disposition: A | Discharge status: Home / Self Care | Payer: MEDICARE | Source: Ambulatory Visit | Attending: Emergency Medicine | Admitting: Emergency Medicine

## 2019-10-24 DIAGNOSIS — K5900 Constipation, unspecified: Secondary | ICD-10-CM

## 2019-10-24 DIAGNOSIS — Z87891 Personal history of nicotine dependence: Secondary | ICD-10-CM | POA: Insufficient documentation

## 2019-10-24 DIAGNOSIS — Z23 Encounter for immunization: Secondary | ICD-10-CM | POA: Insufficient documentation

## 2019-10-24 DIAGNOSIS — Z888 Allergy status to other drugs, medicaments and biological substances status: Secondary | ICD-10-CM | POA: Insufficient documentation

## 2019-10-24 DIAGNOSIS — R1084 Generalized abdominal pain: Secondary | ICD-10-CM | POA: Insufficient documentation

## 2019-10-24 DIAGNOSIS — E739 Lactose intolerance, unspecified: Secondary | ICD-10-CM | POA: Insufficient documentation

## 2019-10-24 LAB — LAB ADD ON ORDER

## 2019-10-24 LAB — HEPATIC FUNCTION PANEL
ALT (GPT): 16 U/L (ref 10–64)
AST (GOT): 23 U/L (ref 9–38)
Albumin: 4.5 g/dL (ref 3.5–5.2)
Alkaline Phosphatase (Total): 67 U/L (ref 36–122)
Bilirubin (Direct): 0.1 mg/dL (ref 0.0–0.3)
Bilirubin (Total): 0.7 mg/dL (ref 0.2–1.3)
Protein (Total): 8.4 g/dL — ABNORMAL HIGH (ref 6.0–8.2)

## 2019-10-24 LAB — URINALYSIS WITH REFLEX CULTURE
Bacteria, URN: NONE SEEN
Bilirubin (Qual), URN: NEGATIVE
Epith Cells_Renal/Trans,URN: NEGATIVE /HPF
Epith Cells_Squamous, URN: NEGATIVE /LPF
Glucose Qual, URN: NEGATIVE mg/dL
Leukocyte Esterase, URN: NEGATIVE
Nitrite, URN: NEGATIVE
Occult Blood, URN: NEGATIVE
Protein (Alb Semiquant), URN: NEGATIVE mg/dL
RBC, URN: NEGATIVE /HPF
Specific Gravity, URN: 1.013 g/mL (ref 1.006–1.027)
WBC, URN: NEGATIVE /HPF
pH, URN: 6.5 (ref 5.0–8.0)

## 2019-10-24 LAB — CBC (HEMOGRAM)
Hematocrit: 40 % (ref 38–50)
Hemoglobin: 13.5 g/dL (ref 13.0–18.0)
MCH: 28.8 pg (ref 27.3–33.6)
MCHC: 33.5 g/dL (ref 32.2–36.5)
MCV: 86 fL (ref 81–98)
Platelet Count: 371 10*3/uL (ref 150–400)
RBC: 4.68 10*6/uL (ref 4.40–5.60)
RDW-CV: 11.9 % (ref 11.6–14.4)
WBC: 12.84 10*3/uL — ABNORMAL HIGH (ref 4.30–10.00)

## 2019-10-24 LAB — 1ST EXTRA ORANGE TOP

## 2019-10-24 LAB — BASIC METABOLIC PANEL
Anion Gap: 8 (ref 4–12)
Calcium: 9.4 mg/dL (ref 8.9–10.2)
Carbon Dioxide, Total: 27 meq/L (ref 22–32)
Chloride: 100 meq/L (ref 98–108)
Creatinine: 1.24 mg/dL — ABNORMAL HIGH (ref 0.51–1.18)
Glucose: 84 mg/dL (ref 62–125)
Potassium: 3.7 meq/L (ref 3.6–5.2)
Sodium: 135 meq/L (ref 135–145)
Urea Nitrogen: 10 mg/dL (ref 8–21)
eGFR by CKD-EPI: >60 mL/min/{1.73_m2} (ref >59)

## 2019-10-24 LAB — 1ST EXTRA PEARL TOP

## 2019-10-24 LAB — LIPASE: Lipase: 26 U/L (ref <70)

## 2019-10-24 LAB — 1ST EXTRA BLUE TOP

## 2019-10-24 MED ORDER — POLYETHYLENE GLYCOL 3350 17 GM/SCOOP OR POWD
17.0000 g | Freq: Two times a day (BID) | ORAL | 0 refills | Status: AC
Start: 2019-10-24 — End: 2019-11-23
  Filled 2019-10-24: qty 1020, 30d supply, fill #0

## 2019-10-24 MED ORDER — COVID-19 AD26 VACCINE(JANSSEN) 0.5 ML IM SUSP
0.5000 mL | Freq: Once | INTRAMUSCULAR | Status: AC
Start: 2019-10-24 — End: 2019-10-24
  Administered 2019-10-24: 0.5 mL via INTRAMUSCULAR
  Filled 2019-10-24: qty 0.5

## 2019-10-24 MED ORDER — DOCUSATE SODIUM 100 MG OR CAPS
100.0000 mg | ORAL_CAPSULE | Freq: Two times a day (BID) | ORAL | 0 refills | Status: AC
Start: 2019-10-24 — End: 2019-11-23
  Filled 2019-10-24: qty 60, 30d supply, fill #0

## 2019-10-24 MED ORDER — SENNOSIDES 8.6 MG OR TABS
8.6000 mg | ORAL_TABLET | Freq: Every evening | ORAL | 0 refills | Status: AC
Start: 2019-10-24 — End: 2019-11-23
  Filled 2019-10-24: qty 30, 30d supply, fill #0

## 2019-10-24 NOTE — ED Notes (Signed)
Pt requests discharge from waiting room, declines to return to private room. DPS presence requested due to behavior towards previous RN.     Mills Koller, RN  10/24/19 1640

## 2019-10-24 NOTE — ED Triage Notes (Signed)
Pt c/o constipation, increased flatulence, & heartburn x 1.5 years. Pt states last BM "1 week ago". Pt denies abd pain. Pt would like to receive the COVID vaccination.

## 2019-10-24 NOTE — Discharge Instructions (Addendum)
Your laboratory testing and CT scan looked reassuring today although the CT scan was limited to not being given contrast. We have set up an After Care Clinic appointment for further evaluation of your symptoms and to work up if you need any further studies, such as a colonoscopy. Please return to the ED if your symptoms acutely worsen or you develop severe pain, inability to eat/drink, or continued inability to defecate. Please take your medications as prescribed for constipation.

## 2019-10-24 NOTE — ED Provider Notes (Addendum)
CHIEF COMPLAINT   Chief Complaint   Patient presents with   . Constipation   . Heartburn            HISTORY OF PRESENT ILLNESS   HPI   Dalton Peck is a 44 year old man with history of right inguinal hernia repair who presents with abdominal pain.  He has not been in his usual state of health for the past 2 years with pain, which he describes as localized in his epigastrium, bandlike in nature, dull in quality, worse after eating, improved with sitting and rest, gradual in onset, mild in severity, waxing waning over time.  He describes associated with intermittent constipation and diarrhea (with his last bowel movement 1 week ago), gassiness, bloating, early satiety, weight loss (weighing about 240 pounds 2 years ago and now with decreased muscle strength), and nausea without vomiting.  He does not regularly follow-up with a primary care doctor, and he has never had a colonoscopy.  He denies any headache, lightheadedness, chest pain, cough, shortness of breath, dysuria, hematuria, and penile/testicular pain.                 PAST MEDICAL AND SURGICAL HISTORY   Past Medical History:   Diagnosis Date   . Mental health problem 01/27/2013   . Smoker 01/27/2013     Right inguinal hernia repair       SOCIAL HISTORY   Social History     Tobacco Use   . Smoking status: Former Smoker     Types: E-Cigarettes        PAST FAMILY HISTORY   The patient has a significant family history of cancer with breast cancer in his sister.        ALLERGIES   Review of patient's allergies indicates:  Allergies   Allergen Reactions   . Depakote [Divalproex Sodium] Skin: Rash   . Lactose Intolerance (Gi)           REVIEW OF SYSTEMS   Review of Systems  10/14 Review of Systems completed and negative except as stated above in the HPI (Systems reviewed: GEN, HENT, Eyes, Resp, CV, GI, GU, MSK, Skin, Neuro)           PHYSICAL EXAM   ED VITALS:       Vitals (Arrival)      T: 36.7 C (10/24/19 0755)  BP: (!) 144/74 (10/24/19 0755)  HR: 90  (10/24/19 0755)  RR: 16 (10/24/19 0755)  SpO2: 100 % (10/24/19 0755) Room air   Vitals (Most recent in last 24 hrs)   T: 36.7 C (10/24/19 0755)  BP: (!) 144/74 (10/24/19 0755)  HR: 90 (10/24/19 0755)  RR: 16 (10/24/19 0755)  SpO2: 100 % (10/24/19 0755) Room air  T range: Temp  Min: 36.7 C  Max: 36.7 C  (no weight taken for this visit)     (no height taken for this visit)     There is no height or weight on file to calculate BMI.       Physical Exam  Vitals signs reviewed.   Constitutional:       Appearance: Normal appearance.   HENT:      Head: Normocephalic and atraumatic.   Eyes:      Extraocular Movements: Extraocular movements intact.      Conjunctiva/sclera: Conjunctivae normal.   Neck:      Musculoskeletal: Normal range of motion and neck supple.   Cardiovascular:      Rate and Rhythm: Normal rate and  regular rhythm.      Pulses: Normal pulses.   Pulmonary:      Effort: Pulmonary effort is normal.      Breath sounds: Normal breath sounds.   Abdominal:      General: Bowel sounds are normal. There is no distension.      Palpations: Abdomen is soft.      Tenderness: There is no abdominal tenderness. There is no right CVA tenderness, left CVA tenderness or guarding.   Musculoskeletal: Normal range of motion.         General: No swelling or tenderness.      Right lower leg: No edema.      Left lower leg: No edema.   Skin:     General: Skin is warm and dry.   Neurological:      General: No focal deficit present.      Mental Status: He is alert.      Sensory: No sensory deficit.      Motor: No weakness.      Gait: Gait normal.   Psychiatric:         Mood and Affect: Mood normal.         Behavior: Behavior normal.           LABORATORY:   Labs Reviewed   CBC (HEMOGRAM) - Abnormal       Result Value    WBC 12.84 (*)     RBC 4.68      Hemoglobin 13.5      Hematocrit 40      MCV 86      MCH 28.8      MCHC 33.5      Platelet Count 371      RDW-CV 11.9     URINALYSIS WITH REFLEX CULTURE - Abnormal    Color, URN Yellow       Clarity, URN Clear      Specific Gravity, URN 1.013      pH, URN 6.5      Protein (Alb Semiquant), URN Negative      Glucose Qual, URN Negative      Ketones, URN 1+ (*)     Bilirubin (Qual), URN Negative      Occult Blood, URN Negative      Nitrite, URN Negative      Leukocyte Esterase, URN Negative      Urobilinogen, URN 0.1-1.9      Comments for Macroscopic, URN None      Collection Method Information not provided      WBC, URN 0-5(NEG)      RBC, URN 0-2(NEG)      Bacteria, URN Not Seen      Epith Cells_Squamous, URN 0-5(NEG)      Epith Cells_Renal/Trans,URN <3(NEG)      Comments For Microscopic, URN None      Mucus, URN Present (*)     1st Extra Urine [facility] Additional collection tube     HEPATIC FUNCTION PANEL - Abnormal    Albumin 4.5      Protein (Total) 8.4 (*)     Bilirubin (Total) 0.7      Bilirubin (Direct) 0.1      Alkaline Phosphatase (Total) 67      AST (GOT) 23      ALT (GPT) 16     BASIC METABOLIC PANEL - Abnormal    Sodium 135      Potassium 3.7      Chloride 100  Carbon Dioxide, Total 27      Anion Gap 8      Glucose 84      Urea Nitrogen 10      Creatinine 1.24 (*)     Calcium 9.4      eGFR, Calculated >60      GFR, Information        Value: Calculated GFR by CKD-EPI equation. Inaccurate with changing renal function. See [email or web address].   LIPASE    Lipase 26     LAB ADD ON ORDER    Lab Test Requested bmp      Specimen Type/Description Blood      Sample To Use please obtain from 1219 collection time. Thanks      Test Request Status Order Processed           IMAGING:     ED Wet Read -   CT Abdomen And Pelvis wo Contrast   Preliminary Result   Limited evaluation due to lack of intravenous contrast. Within this limitation, no acute abdominopelvic abnormalities.      This is a preliminary report dictated by Willette Cluster, MD ([id number]-[id number]). Unless a final report appears below, an attending radiologist has not reviewed the images, and the report is not finalized.                           Radiology Final Result -   No image results found.    EKG: normal EKG, normal sinus rhythm, no evidence of arrhythmia or ischemia.          ED COURSE/MEDICAL DECISION MAKING     Clinical Impressions as of Oct 24 1622   Generalized abdominal pain   Constipation, unspecified constipation type   The patient is a 43 year old man with history of right inguinal hernia repair who presents with abdominal pain. He was initially afebrile, hemodynamically stable, and satting well on room air.  His physical exam was remarkable for a comfortable appearing man in no acute distress with a soft nontender abdomen, no CVA tenderness, and no focal neurologic deficits.    The differential diagnoses for this patient symptoms include: small bowel obstruction (particularly given his constipation and abdominal surgery but no severe nausea or vomiting), malignancy (particularly given his bloating, early satiety, and weight loss), diverticulitis (particularly given his intermittent diarrhea and constipation but no tenderness on exam), pancreatitis (but no epigastric pain radiating to the back), cholecystitis (but no localized right upper quadrant pain), gastroenteritis (but no vomiting and diarrhea), testicular torsion (but no testicular pain), epididymitis/orchitis (but no testicular pain), pyelonephritis (but no urinary symptoms or flank pain), nephrolithiasis (but no flank pain radiating into the groin), cystitis/prostatitis (but no urinary symptoms), acute coronary syndrome (but atypical history), and pneumonia (no fevers, cough, or signs on exam).    His laboratory testing demonstrated mild leukocytosis but no anemia, no electrolyte abnormalities, no AKI, no transaminitis, no evidence of hematuria or urinary infection, and normal lipase, and his EKG showed no evidence of arrhythmia or ischemia. The CTAP showed no abnormalities albeit the exam was limited to not having contrast (per the patient's request even  after the risks/benefits of the study were discussed with him). He was also given the COVID vaccine.    Because malignancy could not be ruled out without a colonoscopy, the patient was set up with an [facility] appointment for further work-up and evaluation of his symptoms.  He was also given strict return precautions  and prescriptions for a bowel regimen.  The plan was discussed at length with the patient, and he was in agreement, expressed understanding, and had his questions answered.                DISPOSITION & IMPRESSION   Impression: Abdominal pain and constipation  Disposition:  Home/self-care                    Olena Heckle, MD  10/24/19 1616       Olena Heckle, MD  10/24/19 1626

## 2019-10-24 NOTE — ED Notes (Signed)
ED Social Work Assessment    ID / CC / Reason for Referral:  Pt support/resources      Identifying data/reason for referral:  Referral Source: Nurse  Referral Reason: Information/Resources    Social Work Summary:  HPI:     Per Therapist, sports, pt requesting assistance w/ resources.    SW met w/ the pt in RME.  Pt reported that he does not have housing and has been staying on the streets. Pt reported that he would like shelter and is not engaged w/ any PCP or CM.  Pt reported that he was at the [facility] today and was told today if he has a written referral to get a bed at the shelter tonight.  SW called the [facility] and left a VM requesting c/b.  SW did not receive a c/b and so wrote the pt a letter.  SW not able to locate the pt in the ED WR and so left the letter w/ the pt's RN in case the pt returns to the ED.  SW will remain available for the pt as needed.    Social History:  Living situation prior to admit: Homeless    Language: English  Interpreter: No     Impression: Pt is a 44 yo male who presented to [facility] ED s/p constipation.  Pt requesting assistance w/ shelter.    Plan: Pt continues to be medically evaluated in the ED.  SW will remain available for the pt as needed.    [place] Worker  [employer]  [phone]  SW Emergency department services (minutes): 30

## 2019-10-26 ENCOUNTER — Other Ambulatory Visit (HOSPITAL_BASED_OUTPATIENT_CLINIC_OR_DEPARTMENT_OTHER): Payer: Self-pay

## 2019-10-26 ENCOUNTER — Ambulatory Visit (HOSPITAL_BASED_OUTPATIENT_CLINIC_OR_DEPARTMENT_OTHER): Payer: Self-pay

## 2019-10-28 ENCOUNTER — Other Ambulatory Visit (HOSPITAL_BASED_OUTPATIENT_CLINIC_OR_DEPARTMENT_OTHER): Payer: Self-pay

## 2019-10-29 ENCOUNTER — Other Ambulatory Visit (HOSPITAL_BASED_OUTPATIENT_CLINIC_OR_DEPARTMENT_OTHER): Payer: Self-pay

## 2019-10-30 ENCOUNTER — Other Ambulatory Visit (HOSPITAL_BASED_OUTPATIENT_CLINIC_OR_DEPARTMENT_OTHER): Payer: Self-pay

## 2020-11-11 ENCOUNTER — Emergency Department: Payer: Self-pay

## 2020-12-19 ENCOUNTER — Emergency Department: Payer: Self-pay

## 2020-12-30 ENCOUNTER — Emergency Department: Payer: Self-pay

## 2022-11-27 ENCOUNTER — Ambulatory Visit (HOSPITAL_BASED_OUTPATIENT_CLINIC_OR_DEPARTMENT_OTHER): Payer: Self-pay | Admitting: Physician Assistant

## 2931-05-31 DEATH — deceased
